# Patient Record
Sex: Female | Born: 1965 | Race: Black or African American | Hispanic: No | Marital: Married | Smoking: Never smoker
Health system: Southern US, Community
[De-identification: ages and names within clinical notes are randomized; demographics above are authoritative.]

## PROBLEM LIST (undated history)

## (undated) DIAGNOSIS — E079 Disorder of thyroid, unspecified: Secondary | ICD-10-CM

## (undated) DIAGNOSIS — D649 Anemia, unspecified: Secondary | ICD-10-CM

## (undated) HISTORY — PX: APPENDECTOMY: SHX54

## (undated) HISTORY — DX: Anemia, unspecified: D64.9

## (undated) HISTORY — DX: Disorder of thyroid, unspecified: E07.9

## (undated) HISTORY — PX: THYROIDECTOMY, PARTIAL: SHX18

---

## 1998-09-14 ENCOUNTER — Other Ambulatory Visit: Admission: RE | Admit: 1998-09-14 | Discharge: 1998-09-14 | Payer: Self-pay | Admitting: Obstetrics and Gynecology

## 1999-01-19 ENCOUNTER — Encounter: Admission: RE | Admit: 1999-01-19 | Discharge: 1999-04-19 | Payer: Self-pay | Admitting: Obstetrics & Gynecology

## 1999-03-01 ENCOUNTER — Inpatient Hospital Stay (HOSPITAL_COMMUNITY): Admission: AD | Admit: 1999-03-01 | Discharge: 1999-03-03 | Payer: Self-pay | Admitting: Obstetrics and Gynecology

## 1999-03-03 ENCOUNTER — Encounter (HOSPITAL_COMMUNITY): Admission: RE | Admit: 1999-03-03 | Discharge: 1999-06-01 | Payer: Self-pay | Admitting: Obstetrics & Gynecology

## 2001-10-25 ENCOUNTER — Emergency Department (HOSPITAL_COMMUNITY): Admission: EM | Admit: 2001-10-25 | Discharge: 2001-10-25 | Payer: Self-pay | Admitting: Emergency Medicine

## 2004-06-02 ENCOUNTER — Inpatient Hospital Stay (HOSPITAL_COMMUNITY): Admission: AD | Admit: 2004-06-02 | Discharge: 2004-06-05 | Payer: Self-pay | Admitting: Obstetrics & Gynecology

## 2006-02-21 ENCOUNTER — Ambulatory Visit (HOSPITAL_COMMUNITY): Admission: RE | Admit: 2006-02-21 | Discharge: 2006-02-22 | Payer: Self-pay | Admitting: General Surgery

## 2006-06-27 ENCOUNTER — Encounter: Admission: RE | Admit: 2006-06-27 | Discharge: 2006-06-27 | Payer: Self-pay | Admitting: Specialist

## 2010-12-23 ENCOUNTER — Encounter: Payer: Self-pay | Admitting: Obstetrics & Gynecology

## 2011-08-01 ENCOUNTER — Ambulatory Visit
Admission: RE | Admit: 2011-08-01 | Discharge: 2011-08-01 | Disposition: A | Discharge status: Home / Self Care | Payer: Self-pay | Source: Ambulatory Visit | Attending: Specialist | Admitting: Specialist

## 2011-08-01 ENCOUNTER — Other Ambulatory Visit: Payer: Self-pay | Admitting: Specialist

## 2011-08-01 DIAGNOSIS — R7611 Nonspecific reaction to tuberculin skin test without active tuberculosis: Secondary | ICD-10-CM

## 2012-10-16 ENCOUNTER — Telehealth: Payer: Self-pay

## 2012-10-16 MED ORDER — LEVOTHYROXINE SODIUM 150 MCG PO TABS: 150.0000 ug | ORAL_TABLET | Freq: Every day | ORAL | Status: DC

## 2012-10-16 NOTE — Telephone Encounter (Signed)
Notified pt that 1 mos Rx was sent in but then she will need OV/labs for add'l. Pt agreed and verified dosage.

## 2012-10-16 NOTE — Telephone Encounter (Signed)
Pt is requesting an rx refill on her thyroid medication her insurance will not be effective until sometime next month, pt will make an appt to see a dr for her next refill, [facility]. [phone]

## 2012-11-27 ENCOUNTER — Ambulatory Visit: Payer: Self-pay | Admitting: Family Medicine

## 2012-11-27 VITALS — BP 133/82 | HR 78 | Temp 98.0°F | Resp 16 | Ht 68.0 in | Wt 196.0 lb

## 2012-11-27 DIAGNOSIS — E039 Hypothyroidism, unspecified: Secondary | ICD-10-CM

## 2012-11-27 LAB — TSH: TSH: 10.301 u[IU]/mL — ABNORMAL HIGH (ref 0.350–4.500)

## 2012-11-27 MED ORDER — LEVOTHYROXINE SODIUM 150 MCG PO TABS: 150.0000 ug | ORAL_TABLET | Freq: Every day | ORAL | Status: DC

## 2012-11-27 NOTE — Progress Notes (Signed)
  Subjective:    Patient ID: Bianca Lewis, female    DOB: January 15, 1966, 46 y.o.   MRN: [id number]  HPI Bianca Lewis is a 46 y.o. female Hx of hypothyroidism., takes synthroid qd. Last ov Oct 2012.  cpe recommended then.  Ran out of synthroid past few days.   Last TSH 2.55 08/22/11 No new side effects with meds. No hair changes, weight change, skin change, but has had some fatigue.  Review of Systems As above.     Objective:   Physical Exam  Vitals reviewed. Constitutional: She is oriented to person, place, and time. She appears well-developed and well-nourished. No distress.  HENT:  Head: Normocephalic and atraumatic.  Right Ear: Hearing, tympanic membrane, external ear and ear canal normal.  Left Ear: Hearing, tympanic membrane, external ear and ear canal normal.  Nose: Nose normal.  Mouth/Throat: Oropharynx is clear and moist. No oropharyngeal exudate.  Eyes: Conjunctivae normal and EOM are normal. Pupils are equal, round, and reactive to light.  Neck: Neck supple. No thyromegaly present.  Cardiovascular: Normal rate, regular rhythm, normal heart sounds and intact distal pulses.   No murmur heard. Pulmonary/Chest: Effort normal and breath sounds normal. No respiratory distress. She has no wheezes. She has no rhonchi.  Lymphadenopathy:    She has no cervical adenopathy.  Neurological: She is alert and oriented to person, place, and time.  Skin: Skin is warm and dry. No rash noted.  Psychiatric: She has a normal mood and affect. Her behavior is normal.       Assessment & Plan:  Bianca Lewis is a 46 y.o. female 1. Hypothyroidism  TSH, levothyroxine (SYNTHROID, LEVOTHROID) 150 MCG tablet   meds refilled, but may need to recheck levels with adherence, as missed few days.  Discussed need for cpe.   . Patient Instructions  Your should receive a call or letter about your lab results within the next week to 10 days. We may need to recheck these levels after 6 weeks of continuous  use of meds if abnormal.  Schedule a complete physical in the next 6 months. Return to the clinic or go to the nearest emergency room if any of your symptoms worsen or new symptoms occur.

## 2012-11-27 NOTE — Patient Instructions (Signed)
Your should receive a call or letter about your lab results within the next week to 10 days. We may need to recheck these levels after 6 weeks of continuous use of meds if abnormal.  Schedule a complete physical in the next 6 months. Return to the clinic or go to the nearest emergency room if any of your symptoms worsen or new symptoms occur.

## 2012-12-06 ENCOUNTER — Other Ambulatory Visit: Payer: Self-pay | Admitting: Family Medicine

## 2012-12-06 DIAGNOSIS — E039 Hypothyroidism, unspecified: Secondary | ICD-10-CM

## 2013-03-23 ENCOUNTER — Ambulatory Visit (INDEPENDENT_AMBULATORY_CARE_PROVIDER_SITE_OTHER): Payer: BC Managed Care – PPO | Admitting: Physician Assistant

## 2013-03-23 VITALS — BP 140/80 | HR 77 | Temp 98.1°F | Resp 18 | Wt 193.0 lb

## 2013-03-23 DIAGNOSIS — J329 Chronic sinusitis, unspecified: Secondary | ICD-10-CM

## 2013-03-23 DIAGNOSIS — J309 Allergic rhinitis, unspecified: Secondary | ICD-10-CM

## 2013-03-23 MED ORDER — IPRATROPIUM BROMIDE 0.03 % NA SOLN: 2.0000 | Freq: Two times a day (BID) | NASAL | Status: DC

## 2013-03-23 MED ORDER — CETIRIZINE HCL 10 MG PO TABS: 10.0000 mg | ORAL_TABLET | Freq: Every day | ORAL | Status: DC

## 2013-03-23 MED ORDER — FLUTICASONE PROPIONATE 50 MCG/ACT NA SUSP: 2.0000 | Freq: Every day | NASAL | Status: DC

## 2013-03-23 MED ORDER — AMOXICILLIN-POT CLAVULANATE 875-125 MG PO TABS: 1.0000 | ORAL_TABLET | Freq: Two times a day (BID) | ORAL | Status: DC

## 2013-03-23 NOTE — Progress Notes (Signed)
  Subjective:    Patient ID: Bianca Lewis, female    DOB: August 02, 1966, 47 y.o.   MRN: [id number]  HPI    [name] is a 47 yr old female here with concern for illness.  States allergies and sinuses are bothering her.  Thinks this has been going on for 1-2 weeks, but has difficulty defining the duration of her symptoms.  Experiencing sinus pressure, esp in her forehead.  Nasal congestion, esp at night.  Fatigue, malaise.  Itchy eyes.  Does have history of allergies, takes Zyrtec prn but not regularly.  Denies fever.  States she has a hx of sinus problems, poorly draining sinuses.     Review of Systems  Constitutional: Positive for fatigue. Negative for fever and chills.  HENT: Positive for congestion, rhinorrhea and sinus pressure. Negative for ear pain.   Respiratory: Negative for cough, shortness of breath and wheezing.   Cardiovascular: Negative.   Gastrointestinal: Negative.   Musculoskeletal: Negative.   Allergic/Immunologic: Positive for environmental allergies.  Neurological: Positive for headaches.       Objective:   Physical Exam  Vitals reviewed. Constitutional: She is oriented to person, place, and time. She appears well-developed and well-nourished. No distress.  HENT:  Head: Normocephalic and atraumatic.  Right Ear: Tympanic membrane and ear canal normal.  Left Ear: Tympanic membrane and ear canal normal.  Nose: Mucosal edema and rhinorrhea present. Right sinus exhibits frontal sinus tenderness (on percussion). Right sinus exhibits no maxillary sinus tenderness. Left sinus exhibits no maxillary sinus tenderness and no frontal sinus tenderness.  Mouth/Throat: Uvula is midline, oropharynx is clear and moist and mucous membranes are normal.  Pale, boggy turbinates  Eyes: Conjunctivae are normal. No scleral icterus.  Neck: Neck supple.  Cardiovascular: Normal rate, regular rhythm and normal heart sounds.  Exam reveals no gallop and no friction rub.   No murmur  heard. Pulmonary/Chest: Effort normal and breath sounds normal. She has no wheezes. She has no rales.  Lymphadenopathy:    She has no cervical adenopathy.  Neurological: She is alert and oriented to person, place, and time.  Skin: Skin is warm and dry.  Psychiatric: She has a normal mood and affect. Her behavior is normal.     Filed Vitals:   03/23/13 1528  BP: 140/80  Pulse: 77  Temp: 98.1 F (36.7 C)  Resp: 18         Assessment & Plan:  Sinusitis - Plan: ipratropium (ATROVENT) 0.03 % nasal spray, amoxicillin-clavulanate (AUGMENTIN) 875-125 MG per tablet  -- Pt with 1-2 weeks of sinus pain/pressure with tenderness on percussion of right frontal sinus.  Will treat with amox/clav x 10 days.  Atrovent nasal spray for relief of congestion. Push fluids.  Rest.  RTC if worsening or not improving.  Allergic rhinitis - Plan: fluticasone (FLONASE) 50 MCG/ACT nasal spray, cetirizine (ZYRTEC) 10 MG tablet  --  Suspect there is an allergic component to her symptoms.  Discussed that poorly controlled allergies may contribute to the development of sinus infections.  Encouraged daily Zyrtec.  Also will begin Flonase daily.  In the future, encouraged her to begin these medicines prior to allergy season to hopefully prevent symptoms from becoming this severe.

## 2013-03-23 NOTE — Patient Instructions (Addendum)
Begin taking the antibiotic as directed.  Be sure to finish the full course.  Take with food to reduce stomach upset.  Begin using the Atrovent nasal spray twice daily to help with congestion.  Continue taking Zyrtec daily to help control your allergies.  Also begin using the Flonase nasal spray daily (serparate this from the Atrovent by 20-30 minutes to get the full effect of both).   Plenty of fluids.  If any of your symptoms are worsening or not improving, please let us know.,   Allergic Rhinitis Allergic rhinitis is when the mucous membranes in the nose respond to allergens. Allergens are particles in the air that cause your body to have an allergic reaction. This causes you to release allergic antibodies. Through a chain of events, these eventually cause you to release histamine into the blood stream (hence the use of antihistamines). Although meant to be protective to the body, it is this release that causes your discomfort, such as frequent sneezing, congestion and an itchy runny nose.  CAUSES  The pollen allergens may come from grasses, trees, and weeds. This is seasonal allergic rhinitis, or "hay fever." Other allergens cause year-round allergic rhinitis (perennial allergic rhinitis) such as house dust mite allergen, pet dander and mold spores.  SYMPTOMS   Nasal stuffiness (congestion).  Runny, itchy nose with sneezing and tearing of the eyes.  There is often an itching of the mouth, eyes and ears. It cannot be cured, but it can be controlled with medications. DIAGNOSIS  If you are unable to determine the offending allergen, skin or blood testing may find it. TREATMENT   Avoid the allergen.  Medications and allergy shots (immunotherapy) can help.  Hay fever may often be treated with antihistamines in pill or nasal spray forms. Antihistamines block the effects of histamine. There are over-the-counter medicines that may help with nasal congestion and swelling around the eyes. Check with  your caregiver before taking or giving this medicine. If the treatment above does not work, there are many new medications your caregiver can prescribe. Stronger medications may be used if initial measures are ineffective. Desensitizing injections can be used if medications and avoidance fails. Desensitization is when a patient is given ongoing shots until the body becomes less sensitive to the allergen. Make sure you follow up with your caregiver if problems continue. SEEK MEDICAL CARE IF:   You develop fever (more than 100.5 F (38.1 C).  You develop a cough that does not stop easily (persistent).  You have shortness of breath.  You start wheezing.  Symptoms interfere with normal daily activities. Document Released: 08/13/2001 Document Revised: 02/10/2012 Document Reviewed: 02/22/2009 [facility] Patient Information 2013 [place], [place].   Sinusitis Sinusitis is redness, soreness, and swelling (inflammation) of the paranasal sinuses. Paranasal sinuses are air pockets within the bones of your face (beneath the eyes, the middle of the forehead, or above the eyes). In healthy paranasal sinuses, mucus is able to drain out, and air is able to circulate through them by way of your nose. However, when your paranasal sinuses are inflamed, mucus and air can become trapped. This can allow bacteria and other germs to grow and cause infection. Sinusitis can develop quickly and last only a short time (acute) or continue over a long period (chronic). Sinusitis that lasts for more than 12 weeks is considered chronic.  CAUSES  Causes of sinusitis include:  Allergies.  Structural abnormalities, such as displacement of the cartilage that separates your nostrils (deviated septum), which can decrease  the air flow through your nose and sinuses and affect sinus drainage.  Functional abnormalities, such as when the small hairs (cilia) that line your sinuses and help remove mucus do not work properly or are not  present. SYMPTOMS  Symptoms of acute and chronic sinusitis are the same. The primary symptoms are pain and pressure around the affected sinuses. Other symptoms include:  Upper toothache.  Earache.  Headache.  Bad breath.  Decreased sense of smell and taste.  A cough, which worsens when you are lying flat.  Fatigue.  Fever.  Thick drainage from your nose, which often is green and may contain pus (purulent).  Swelling and warmth over the affected sinuses. DIAGNOSIS  Your caregiver will perform a physical exam. During the exam, your caregiver may:  Look in your nose for signs of abnormal growths in your nostrils (nasal polyps).  Tap over the affected sinus to check for signs of infection.  View the inside of your sinuses (endoscopy) with a special imaging device with a light attached (endoscope), which is inserted into your sinuses. If your caregiver suspects that you have chronic sinusitis, one or more of the following tests may be recommended:  Allergy tests.  Nasal culture A sample of mucus is taken from your nose and sent to a lab and screened for bacteria.  Nasal cytology A sample of mucus is taken from your nose and examined by your caregiver to determine if your sinusitis is related to an allergy. TREATMENT  Most cases of acute sinusitis are related to a viral infection and will resolve on their own within 10 days. Sometimes medicines are prescribed to help relieve symptoms (pain medicine, decongestants, nasal steroid sprays, or saline sprays).  However, for sinusitis related to a bacterial infection, your caregiver will prescribe antibiotic medicines. These are medicines that will help kill the bacteria causing the infection.  Rarely, sinusitis is caused by a fungal infection. In theses cases, your caregiver will prescribe antifungal medicine. For some cases of chronic sinusitis, surgery is needed. Generally, these are cases in which sinusitis recurs more than 3 times  per year, despite other treatments. HOME CARE INSTRUCTIONS   Drink plenty of water. Water helps thin the mucus so your sinuses can drain more easily.  Use a humidifier.  Inhale steam 3 to 4 times a day (for example, sit in the bathroom with the shower running).  Apply a warm, moist washcloth to your face 3 to 4 times a day, or as directed by your caregiver.  Use saline nasal sprays to help moisten and clean your sinuses.  Take over-the-counter or prescription medicines for pain, discomfort, or fever only as directed by your caregiver. SEEK IMMEDIATE MEDICAL CARE IF:  You have increasing pain or severe headaches.  You have nausea, vomiting, or drowsiness.  You have swelling around your face.  You have vision problems.  You have a stiff neck.  You have difficulty breathing. MAKE SURE YOU:   Understand these instructions.  Will watch your condition.  Will get help right away if you are not doing well or get worse. Document Released: 11/18/2005 Document Revised: 02/10/2012 Document Reviewed: 12/03/2011 [facility] Patient Information 2013 [place], [place].

## 2013-06-07 ENCOUNTER — Telehealth: Payer: Self-pay

## 2013-06-07 DIAGNOSIS — E039 Hypothyroidism, unspecified: Secondary | ICD-10-CM

## 2013-06-07 MED ORDER — LEVOTHYROXINE SODIUM 150 MCG PO TABS: 150.0000 ug | ORAL_TABLET | Freq: Every day | ORAL | Status: DC

## 2013-06-07 NOTE — Telephone Encounter (Signed)
Patient needs refill on thyroid medication Synthroid 150 mg. [facility] [phone]

## 2013-06-07 NOTE — Telephone Encounter (Signed)
Sent 15 d supply appt needed/ overdue

## 2013-06-24 ENCOUNTER — Ambulatory Visit (INDEPENDENT_AMBULATORY_CARE_PROVIDER_SITE_OTHER): Payer: BC Managed Care – PPO | Admitting: Internal Medicine

## 2013-06-24 VITALS — BP 134/78 | HR 81 | Temp 97.6°F | Resp 18 | Ht 62.0 in | Wt 200.0 lb

## 2013-06-24 DIAGNOSIS — E039 Hypothyroidism, unspecified: Secondary | ICD-10-CM

## 2013-06-24 DIAGNOSIS — D649 Anemia, unspecified: Secondary | ICD-10-CM

## 2013-06-24 DIAGNOSIS — Z833 Family history of diabetes mellitus: Secondary | ICD-10-CM

## 2013-06-24 LAB — COMPREHENSIVE METABOLIC PANEL
Albumin: 3.7 g/dL (ref 3.5–5.2)
BUN: 13 mg/dL (ref 6–23)
CO2: 21 mEq/L (ref 19–32)
Creat: 0.72 mg/dL (ref 0.50–1.10)
Glucose, Bld: 94 mg/dL (ref 70–99)
Potassium: 3.8 mEq/L (ref 3.5–5.3)
Sodium: 137 mEq/L (ref 135–145)
Total Protein: 7.1 g/dL (ref 6.0–8.3)

## 2013-06-24 LAB — GLUCOSE, POCT (MANUAL RESULT ENTRY): POC Glucose: 98 mg/dl (ref 70–99)

## 2013-06-24 LAB — POCT CBC
Hemoglobin: 9.2 g/dL — AB (ref 12.2–16.2)
MCH, POC: 21.5 pg — AB (ref 27–31.2)
MCHC: 29.4 g/dL — AB (ref 31.8–35.4)
MPV: 9.5 fL (ref 0–99.8)
POC Granulocyte: 3 (ref 2–6.9)
POC LYMPH PERCENT: 36.9 %L (ref 10–50)
POC MID %: 11.3 %M (ref 0–12)
RBC: 4.28 M/uL (ref 4.04–5.48)
RDW, POC: 19.2 %
WBC: 5.7 10*3/uL (ref 4.6–10.2)

## 2013-06-24 LAB — LIPID PANEL
LDL Cholesterol: 119 mg/dL — ABNORMAL HIGH (ref 0–99)
Triglycerides: 81 mg/dL (ref <150)
VLDL: 16 mg/dL (ref 0–40)

## 2013-06-24 LAB — IRON AND TIBC: Iron: 18 ug/dL — ABNORMAL LOW (ref 42–145)

## 2013-06-24 LAB — POCT GLYCOSYLATED HEMOGLOBIN (HGB A1C): Hemoglobin A1C: 6.2

## 2013-06-24 MED ORDER — LEVOTHYROXINE SODIUM 150 MCG PO TABS: 150.0000 ug | ORAL_TABLET | Freq: Every day | ORAL | Status: DC

## 2013-06-24 NOTE — Progress Notes (Signed)
  Subjective:    Patient ID: Bianca Lewis, female    DOB: 1966-02-17, 47 y.o.   MRN: [id number]  HPI Doing well, gaining weight, no problems. Here for thyroid medicine. No record of cpe in paper chart or epic. Will do blood work and she is to schedule cpe.   Review of Systems     Objective:   Physical Exam  Vitals reviewed. Constitutional: She is oriented to person, place, and time. She appears well-developed and well-nourished. No distress.  HENT:  Right Ear: External ear normal.  Left Ear: External ear normal.  Eyes: EOM are normal.  Neck: Normal range of motion. Neck supple. No thyromegaly present.  Cardiovascular: Normal rate, regular rhythm and normal heart sounds.   Pulmonary/Chest: Effort normal and breath sounds normal.  Musculoskeletal: Normal range of motion.  Neurological: She is alert and oriented to person, place, and time. She has normal reflexes. She exhibits normal muscle tone. Coordination normal.  Psychiatric: She has a normal mood and affect.   Results for orders placed in visit on 06/24/13  POCT CBC      Result Value Range   WBC 5.7  4.6 - 10.2 K/uL   Lymph, poc 2.1  0.6 - 3.4   POC LYMPH PERCENT 36.9  10 - 50 %L   MID (cbc) 0.6  0 - 0.9   POC MID % 11.3  0 - 12 %M   POC Granulocyte 3.0  2 - 6.9   Granulocyte percent 51.8  37 - 80 %G   RBC 4.28  4.04 - 5.48 M/uL   Hemoglobin 9.2 (*) 12.2 - 16.2 g/dL   HCT, POC 27.2 (*) 53.6 - 47.9 %   MCV 73.1 (*) 80 - 97 fL   MCH, POC 21.5 (*) 27 - 31.2 pg   MCHC 29.4 (*) 31.8 - 35.4 g/dL   RDW, POC 64.4     Platelet Count, POC 260  142 - 424 K/uL   MPV 9.5  0 - 99.8 fL  GLUCOSE, POCT (MANUAL RESULT ENTRY)      Result Value Range   POC Glucose 98  70 - 99 mg/dl  POCT GLYCOSYLATED HEMOGLOBIN (HGB A1C)      Result Value Range   Hemoglobin A1C 6.2      Anemia low mcv will add iron tibc  Hypothyroid/Overweight/Family hx diabetes.    Assessment & Plan:  Need CPE before next refill/Start womens vitamin with  iron/Schedule f/up 104

## 2013-06-24 NOTE — Patient Instructions (Addendum)
Diabetes and Exercise Regular exercise is important and can help:   Control blood glucose (sugar).  Decrease blood pressure.    Control blood lipids (cholesterol, triglycerides).  Improve overall health. BENEFITS FROM EXERCISE  Improved fitness.  Improved flexibility.  Improved endurance.  Increased bone density.  Weight control.  Increased muscle strength.  Decreased body fat.  Improvement of the body's use of insulin, a hormone.  Increased insulin sensitivity.  Reduction of insulin needs.  Reduced stress and tension.  Helps you feel better. People with diabetes who add exercise to their lifestyle gain additional benefits, including:  Weight loss.  Reduced appetite.  Improvement of the body's use of blood glucose.  Decreased risk factors for heart disease:  Lowering of cholesterol and triglycerides.  Raising the level of good cholesterol (high-density lipoproteins, HDL).  Lowering blood sugar.  Decreased blood pressure. TYPE 1 DIABETES AND EXERCISE  Exercise will usually lower your blood glucose.  If blood glucose is greater than 240 mg/dl, check urine ketones. If ketones are present, do not exercise.  Location of the insulin injection sites may need to be adjusted with exercise. Avoid injecting insulin into areas of the body that will be exercised. For example, avoid injecting insulin into:  The arms when playing tennis.  The legs when jogging. For more information, discuss this with your caregiver.  Keep a record of:  Food intake.  Type and amount of exercise.  Expected peak times of insulin action.  Blood glucose levels. Do this before, during, and after exercise. Review your records with your caregiver. This will help you to develop guidelines for adjusting food intake and insulin amounts.  TYPE 2 DIABETES AND EXERCISE  Regular physical activity can help control blood glucose.  Exercise is important because it may:  Increase the  body's sensitivity to insulin.  Improve blood glucose control.  Exercise reduces the risk of heart disease. It decreases serum cholesterol and triglycerides. It also lowers blood pressure.  Those who take insulin or oral hypoglycemic agents should watch for signs of hypoglycemia. These signs include dizziness, shaking, sweating, chills, and confusion.  Body water is lost during exercise. It must be replaced. This will help to avoid loss of body fluids (dehydration) or heat stroke. Be sure to talk to your caregiver before starting an exercise program to make sure it is safe for you. Remember, any activity is better than none.  Document Released: 02/08/2004 Document Revised: 02/10/2012 Document Reviewed: 05/25/2009 [facility] Patient Information 2014 [place], [place]. Thyroid Diseases Your thyroid is a butterfly-shaped gland in your neck. It is located just above your collarbone. It is one of your endocrine glands, which make hormones. The thyroid helps set your metabolism. Metabolism is how your body gets energy from the foods you eat.  Millions of people have thyroid diseases. Women experience thyroid problems more often than men. In fact, overactive thyroid problems (hyperthyroidism) occur in 1% of all women. If you have a thyroid disease, your body may use energy more slowly or quickly than it should.  Thyroid problems also include an immune disease where your body reacts against your thyroid gland (called thyroiditis). A different problem involves lumps and bumps (called nodules) that develop in the gland. The nodules are usually, but not always, noncancerous. THE MOST COMMON THYROID PROBLEMS AND CAUSES ARE DISCUSSED BELOW There are many causes for thyroid problems. Treatment depends upon the exact diagnosis and includes trying to reset your body's metabolism to a normal rate. Hyperthyroidism Too much thyroid hormone from an  overactive thyroid gland is called hyperthyroidism. In hyperthyroidism,  the body's metabolism speeds up. One of the most frequent forms of hyperthyroidism is known as Graves' disease. Graves' disease tends to run in families. Although Graves' is thought to be caused by a problem with the immune system, the exact nature of the genetic problem is unknown. Hypothyroidism Too little thyroid hormone from an underactive thyroid gland is called hypothyroidism. In hypothyroidism, the body's metabolism is slowed. Several things can cause this condition. Most causes affect the thyroid gland directly and hurt its ability to make enough hormone.  Rarely, there may be a pituitary gland tumor (located near the base of the brain). The tumor can block the pituitary from producing thyroid-stimulating hormone (TSH). Your body makes TSH to stimulate the thyroid to work properly. If the pituitary does not make enough TSH, the thyroid fails to make enough hormones needed for good health. Whether the problem is caused by thyroid conditions or by the pituitary gland, the result is that the thyroid is not making enough hormones. Hypothyroidism causes many physical and mental processes to become sluggish. The body consumes less oxygen and produces less body heat. Thyroid Nodules A thyroid nodule is a small swelling or lump in the thyroid gland. They are common. These nodules represent either a growth of thyroid tissue or a fluid-filled cyst. Both form a lump in the thyroid gland. Almost half of all people will have tiny thyroid nodules at some point in their lives. Typically, these are not noticeable until they become large and affect normal thyroid size. Larger nodules that are greater than a half inch across (about 1 centimeter) occur in about 5 percent of people. Although most nodules are not cancerous, people who have them should seek medical care to rule out cancer. Also, some thyroid nodules may produce too much thyroid hormone or become too large. Large nodules or a large gland can interfere with  breathing or swallowing or may cause neck discomfort. Other problems Other thyroid problems include cancer and thyroiditis. Thyroiditis is a malfunction of the body's immune system. Normally, the immune system works to defend the body against infection and other problems. When the immune system is not working properly, it may mistakenly attack normal cells, tissues, and organs. Examples of autoimmune diseases are Hashimoto's thyroiditis (which causes low thyroid function) and Graves' disease (which causes excess thyroid function). SYMPTOMS  Symptoms vary greatly depending upon the exact type of problem with the thyroid. Hyperthyroidism-is when your thyroid is too active and makes more thyroid hormone than your body needs. The most common cause is Graves' Disease. Too much thyroid hormone can cause some or all of the following symptoms:  Anxiety.  Irritability.  Difficulty sleeping.  Fatigue.  A rapid or irregular heartbeat.  A fine tremor of your hands or fingers.  An increase in perspiration.  Sensitivity to heat.  Weight loss, despite normal food intake.  Brittle hair.  Enlargement of your thyroid gland (goiter).  Light menstrual periods.  Frequent bowel movements. Graves' disease can specifically cause eye and skin problems. The skin problems involve reddening and swelling of the skin, often on your shins and on the top of your feet. Eye problems can include the following:  Excess tearing and sensation of grit or sand in either or both eyes.  Reddened or inflamed eyes.  Widening of the space between your eyelids.  Swelling of the lids and tissues around the eyes.  Light sensitivity.  Ulcers on the cornea.  Double vision.  Limited eye movements.  Blurred or reduced vision. Hypothyroidism- is when your thyroid gland is not active enough. This is more common than hyperthyroidism. Symptoms can vary a lot depending of the severity of the hormone deficiency. Symptoms  may develop over a long period of time and can include several of the following:  Fatigue.  Sluggishness.  Increased sensitivity to cold.  Constipation.  Pale, dry skin.  A puffy face.  Hoarse voice.  High blood cholesterol level.  Unexplained weight gain.  Muscle aches, tenderness and stiffness.  Pain, stiffness or swelling in your joints.  Muscle weakness.  Heavier than normal menstrual periods.  Brittle fingernails and hair.  Depression. Thyroid Nodules - most do not cause signs or symptoms. Occasionally, some may become so large that you can feel or even see the swelling at the base of your neck. You may realize a lump or swelling is there when you are shaving or putting on makeup. Men might become aware of a nodule when shirt collars suddenly feel too tight. Some nodules produce too much thyroid hormone. This can produce the same symptoms as hyperthyroidism (see above). Thyroid nodules are seldom cancerous. However, a nodule is more likely to be malignant (cancerous) if it:  Grows quickly or feels hard.  Causes you to become hoarse or to have trouble swallowing or breathing.  Causes enlarged lymph nodes under your jaw or in your neck. DIAGNOSIS  Because there are so many possible thyroid conditions, your caregiver may ask for a number of tests. They will do this in order to narrow down the exact diagnosis. These tests can include:  Blood and antibody tests.  Special thyroid scans using small, safe amounts of radioactive iodine.  Ultrasound of the thyroid gland (particularly if there is a nodule or lump).  Biopsy. This is usually done with a special needle. A needle biopsy is a procedure to obtain a sample of cells from the thyroid. The tissue will be tested in a lab and examined under a microscope. TREATMENT  Treatment depends on the exact diagnosis. Hyperthyroidism  Beta-blockers help relieve many of the symptoms.  Anti-thyroid medications prevent the  thyroid from making excess hormones.  Radioactive iodine treatment can destroy overactive thyroid cells. The iodine can permanently decrease the amount of hormone produced.  Surgery to remove the thyroid gland.  Treatments for eye problems that come from Graves' disease also include medications and special eye surgery, if felt to be appropriate. Hypothyroidism Thyroid replacement with levothyroxine is the mainstay of treatment. Treatment with thyroid replacement is usually lifelong and will require monitoring and adjustment from time to time. Thyroid Nodules  Watchful waiting. If a small nodule causes no symptoms or signs of cancer on biopsy, then no treatment may be chosen at first. Re-exam and re-checking blood tests would be the recommended follow-up.  Anti-thyroid medications or radioactive iodine treatment may be recommended if the nodules produce too much thyroid hormone (see Treatment for Hyperthyroidism above).  Alcohol ablation. Injections of small amounts of ethyl alcohol (ethanol) can cause a non-cancerous nodule to shrink in size.  Surgery (see Treatment for Hyperthyroidism above). HOME CARE INSTRUCTIONS   Take medications as instructed.  Follow through on recommended testing. SEEK MEDICAL CARE IF:   You feel that you are developing symptoms of Hyperthyroidism or Hypothyroidism as described above.  You develop a new lump/nodule in the neck/thyroid area that you had not noticed before.  You feel that you are having side effects from medicines prescribed.  You develop  trouble breathing or swallowing. SEEK IMMEDIATE MEDICAL CARE IF:   You develop a fever of 102 F (38.9 C) or higher.  You develop severe sweating.  You develop palpitations and/or rapid heart beat.  You develop shortness of breath.  You develop nausea and vomiting.  You develop extreme shakiness.  You develop agitation.  You develop lightheadedness or have a fainting episode. Document  Released: 09/15/2007 Document Revised: 02/10/2012 Document Reviewed: 09/15/2007 [facility] Patient Information 2014 [place], [place]. Anemia, Nonspecific Your exam and blood tests show you are anemic. This means your blood (hemoglobin) level is low. Normal hemoglobin values are 12 to 15 g/dL for females and 14 to 17 g/dL for males. Make a note of your hemoglobin level today. The hematocrit percent is also used to measure anemia. A normal hematocrit is 38% to 46% in females and 42% to 49% in males. Make a note of your hematocrit level today. CAUSES  Anemia can be due to many different causes.  Excessive bleeding from periods (in women).  Intestinal bleeding.  Poor nutrition.  Kidney, thyroid, liver, and bone marrow diseases. SYMPTOMS  Anemia can come on suddenly (acute). It can also come on slowly. Symptoms can include:  Minor weakness.  Dizziness.  Palpitations.  Shortness of breath. Symptoms may be absent until half your hemoglobin is missing if it comes on slowly. Anemia due to acute blood loss from an injury or internal bleeding may require blood transfusion if the loss is severe. Hospital care is needed if you are anemic and there is significant continual blood loss. TREATMENT   Stool tests for blood (Hemoccult) and additional lab tests are often needed. This determines the best treatment.  Further checking on your condition and your response to treatment is very important. It often takes many weeks to correct anemia. Depending on the cause, treatment can include:  Supplements of iron.  Vitamins B12 and folic acid.  Hormone medicines. If your anemia is due to bleeding, finding the cause of the blood loss is very important. This will help avoid further problems. SEEK IMMEDIATE MEDICAL CARE IF:   You develop fainting, extreme weakness, shortness of breath, or chest pain.  You develop heavy vaginal bleeding.  You develop bloody or black, tarry stools or vomit up  blood.  You develop a high fever, rash, repeated vomiting, or dehydration. Document Released: 12/26/2004 Document Revised: 02/10/2012 Document Reviewed: 10/03/2009 [facility] Patient Information 2014 [place], [place].

## 2013-06-25 ENCOUNTER — Encounter: Payer: Self-pay | Admitting: Family Medicine

## 2013-07-01 ENCOUNTER — Telehealth: Payer: Self-pay

## 2013-07-01 NOTE — Telephone Encounter (Signed)
Pt is calling about her lab results  Best number [phone]

## 2013-07-02 NOTE — Telephone Encounter (Signed)
Your thyroid is normal, your iron is low and needs replacement. Recheck your cbc and iron in 1 month. LMOM to CB

## 2013-07-02 NOTE — Telephone Encounter (Signed)
Pt.notified

## 2014-01-05 ENCOUNTER — Ambulatory Visit (INDEPENDENT_AMBULATORY_CARE_PROVIDER_SITE_OTHER): Payer: BC Managed Care – PPO | Admitting: Emergency Medicine

## 2014-01-05 VITALS — BP 150/90 | HR 61 | Temp 98.3°F | Resp 17 | Ht 62.0 in | Wt 194.0 lb

## 2014-01-05 DIAGNOSIS — J309 Allergic rhinitis, unspecified: Secondary | ICD-10-CM

## 2014-01-05 DIAGNOSIS — E039 Hypothyroidism, unspecified: Secondary | ICD-10-CM

## 2014-01-05 LAB — POCT CBC
GRANULOCYTE PERCENT: 51.7 % (ref 37–80)
HCT, POC: 36 % — AB (ref 37.7–47.9)
Hemoglobin: 10.5 g/dL — AB (ref 12.2–16.2)
LYMPH, POC: 2 (ref 0.6–3.4)
MCH, POC: 22.8 pg — AB (ref 27–31.2)
MCHC: 29.2 g/dL — AB (ref 31.8–35.4)
MCV: 78.1 fL — AB (ref 80–97)
MID (cbc): 0.5 (ref 0–0.9)
MPV: 11.4 fL (ref 0–99.8)
POC Granulocyte: 2.7 (ref 2–6.9)
POC LYMPH %: 38.7 % (ref 10–50)
POC MID %: 9.6 %M (ref 0–12)
Platelet Count, POC: 213 10*3/uL (ref 142–424)
RBC: 4.61 M/uL (ref 4.04–5.48)
RDW, POC: 21 %
WBC: 5.2 10*3/uL (ref 4.6–10.2)

## 2014-01-05 LAB — TSH: TSH: 0.765 u[IU]/mL (ref 0.350–4.500)

## 2014-01-05 LAB — T4, FREE: FREE T4: 1.01 ng/dL (ref 0.80–1.80)

## 2014-01-05 MED ORDER — LEVOTHYROXINE SODIUM 150 MCG PO TABS: 150.0000 ug | ORAL_TABLET | Freq: Every day | ORAL | Status: DC

## 2014-01-05 MED ORDER — FLUTICASONE PROPIONATE 50 MCG/ACT NA SUSP: 2.0000 | Freq: Every day | NASAL | Status: DC

## 2014-01-05 MED ORDER — AMOXICILLIN 875 MG PO TABS: 875.0000 mg | ORAL_TABLET | Freq: Two times a day (BID) | ORAL | Status: DC

## 2014-01-05 NOTE — Patient Instructions (Addendum)
Sinusitis Sinusitis is redness, soreness, and swelling (inflammation) of the paranasal sinuses. Paranasal sinuses are air pockets within the bones of your face (beneath the eyes, the middle of the forehead, or above the eyes). In healthy paranasal sinuses, mucus is able to drain out, and air is able to circulate through them by way of your nose. However, when your paranasal sinuses are inflamed, mucus and air can become trapped. This can allow bacteria and other germs to grow and cause infection. Sinusitis can develop quickly and last only a short time (acute) or continue over a long period (chronic). Sinusitis that lasts for more than 12 weeks is considered chronic.  CAUSES  Causes of sinusitis include:  Allergies.  Structural abnormalities, such as displacement of the cartilage that separates your nostrils (deviated septum), which can decrease the air flow through your nose and sinuses and affect sinus drainage.  Functional abnormalities, such as when the small hairs (cilia) that line your sinuses and help remove mucus do not work properly or are not present. SYMPTOMS  Symptoms of acute and chronic sinusitis are the same. The primary symptoms are pain and pressure around the affected sinuses. Other symptoms include:  Upper toothache.  Earache.  Headache.  Bad breath.  Decreased sense of smell and taste.  A cough, which worsens when you are lying flat.  Fatigue.  Fever.  Thick drainage from your nose, which often is green and may contain pus (purulent).  Swelling and warmth over the affected sinuses. DIAGNOSIS  Your caregiver will perform a physical exam. During the exam, your caregiver may:  Look in your nose for signs of abnormal growths in your nostrils (nasal polyps).  Tap over the affected sinus to check for signs of infection.  View the inside of your sinuses (endoscopy) with a special imaging device with a light attached (endoscope), which is inserted into your  sinuses. If your caregiver suspects that you have chronic sinusitis, one or more of the following tests may be recommended:  Allergy tests.  Nasal culture A sample of mucus is taken from your nose and sent to a lab and screened for bacteria.  Nasal cytology A sample of mucus is taken from your nose and examined by your caregiver to determine if your sinusitis is related to an allergy. TREATMENT  Most cases of acute sinusitis are related to a viral infection and will resolve on their own within 10 days. Sometimes medicines are prescribed to help relieve symptoms (pain medicine, decongestants, nasal steroid sprays, or saline sprays).  However, for sinusitis related to a bacterial infection, your caregiver will prescribe antibiotic medicines. These are medicines that will help kill the bacteria causing the infection.  Rarely, sinusitis is caused by a fungal infection. In theses cases, your caregiver will prescribe antifungal medicine. For some cases of chronic sinusitis, surgery is needed. Generally, these are cases in which sinusitis recurs more than 3 times per year, despite other treatments. HOME CARE INSTRUCTIONS   Drink plenty of water. Water helps thin the mucus so your sinuses can drain more easily.  Use a humidifier.  Inhale steam 3 to 4 times a day (for example, sit in the bathroom with the shower running).  Apply a warm, moist washcloth to your face 3 to 4 times a day, or as directed by your caregiver.  Use saline nasal sprays to help moisten and clean your sinuses.  Take over-the-counter or prescription medicines for pain, discomfort, or fever only as directed by your caregiver. SEEK IMMEDIATE MEDICAL   CARE IF:  You have increasing pain or severe headaches.  You have nausea, vomiting, or drowsiness.  You have swelling around your face.  You have vision problems.  You have a stiff neck.  You have difficulty breathing. MAKE SURE YOU:   Understand these  instructions.  Will watch your condition.  Will get help right away if you are not doing well or get worse. Document Released: 11/18/2005 Document Revised: 02/10/2012 Document Reviewed: 12/03/2011 [facility] Patient Information 2014 [place], [place]. Anemia, Nonspecific Anemia is a condition in which the concentration of red blood cells or hemoglobin in the blood is below normal. Hemoglobin is a substance in red blood cells that carries oxygen to the tissues of the body. Anemia results in not enough oxygen reaching these tissues.  CAUSES  Common causes of anemia include:   Excessive bleeding. Bleeding may be internal or external. This includes excessive bleeding from periods (in women) or from the intestine.   Poor nutrition.   Chronic kidney, thyroid, and liver disease.  Bone marrow disorders that decrease red blood cell production.  Cancer and treatments for cancer.  HIV, AIDS, and their treatments.  Spleen problems that increase red blood cell destruction.  Blood disorders.  Excess destruction of red blood cells due to infection, medicines, and autoimmune disorders. SIGNS AND SYMPTOMS   Minor weakness.   Dizziness.   Headache.  Palpitations.   Shortness of breath, especially with exercise.   Paleness.  Cold sensitivity.  Indigestion.  Nausea.  Difficulty sleeping.  Difficulty concentrating. Symptoms may occur suddenly or they may develop slowly.  DIAGNOSIS  Additional blood tests are often needed. These help your health care provider determine the best treatment. Your health care provider will check your stool for blood and look for other causes of blood loss.  TREATMENT  Treatment varies depending on the cause of the anemia. Treatment can include:   Supplements of iron, vitamin D98, or folic acid.   Hormone medicines.   A blood transfusion. This may be needed if blood loss is severe.   Hospitalization. This may be needed if there is  significant continual blood loss.   Dietary changes.  Spleen removal. HOME CARE INSTRUCTIONS Keep all follow-up appointments. It often takes many weeks to correct anemia, and having your health care provider check on your condition and your response to treatment is very important. SEEK IMMEDIATE MEDICAL CARE IF:   You develop extreme weakness, shortness of breath, or chest pain.   You become dizzy or have trouble concentrating.  You develop heavy vaginal bleeding.   You develop a rash.   You have bloody or black, tarry stools.   You faint.   You vomit up blood.   You vomit repeatedly.   You have abdominal pain.  You have a fever or persistent symptoms for more than 2 3 days.   You have a fever and your symptoms suddenly get worse.   You are dehydrated.  MAKE SURE YOU:  Understand these instructions.  Will watch your condition.  Will get help right away if you are not doing well or get worse. Document Released: 12/26/2004 Document Revised: 07/21/2013 Document Reviewed: 05/14/2013 [facility] Patient Information 2014 [place].

## 2014-01-05 NOTE — Progress Notes (Addendum)
   Subjective:  This chart was scribed for Wardell Honour, MD by Eugenia Mcalpine, ED Scribe. This patient was seen in room Room/bed info not found and the patient's care was started at 12:19 PM.   Patient ID: Bianca Lewis, female    DOB: 11-21-1966, 48 y.o.   MRN: [id number]  Sinusitis Associated symptoms include congestion and sinus pressure.   HPI Comments: Bianca Lewis is a 48 y.o. female who presents to the Emergency Department to do her blood work for her thyroid medication. She ran out of her medication 3x days ago. She also complains of sinus congestion with yellow sputum that is worse at night; she states that she also needs nasal spray for the sinus congestion.  Pt has had prior occurences of similar symptoms.  Pt typically used antibiotics and OTC medications for her sinus congestion.  Patient Active Problem List   Diagnosis Date Noted  . Hypothyroidism 11/27/2012   Past Medical History  Diagnosis Date  . Thyroid disease    Past Surgical History  Procedure Laterality Date  . Appendectomy     No Known Allergies Prior to Admission medications   Medication Sig Start Date End Date Taking? Authorizing Provider  levothyroxine (SYNTHROID, LEVOTHROID) 150 MCG tablet Take 1 tablet (150 mcg total) by mouth daily. 06/24/13  Yes Orma Flaming, MD   History   Social History  . Marital Status: Married    Spouse Name: N/A    Number of Children: N/A  . Years of Education: N/A   Occupational History  . CNA    Social History Main Topics  . Smoking status: Never Smoker   . Smokeless tobacco: Not on file  . Alcohol Use: No  . Drug Use: No  . Sexual Activity: Yes    Birth Control/ Protection: None   Other Topics Concern  . Not on file   Social History Narrative  . No narrative on file        Review of Systems  HENT: Positive for congestion, rhinorrhea and sinus pressure.        Objective:   Physical Exam  Constitutional: She appears well-developed.  HENT:  Head:  Normocephalic.  Nose: Mucosal edema and rhinorrhea present.  Mouth/Throat: No oropharyngeal exudate.  Swollen nasal turbinates and purulent drainage on the right  Eyes: EOM are normal. Pupils are equal, round, and reactive to light.  Neck: Neck supple.  Cardiovascular: Normal rate, regular rhythm and normal heart sounds.   No murmur heard. Pulmonary/Chest: Effort normal. No respiratory distress.  Lymphadenopathy:    She has no cervical adenopathy.    Filed Vitals:   01/05/14 1125  BP: 150/90  Pulse: 61  Temp: 98.3 F (36.8 C)  TempSrc: Oral  Resp: 17  Height: 5\' 2"  (1.575 m)  Weight: 194 lb (87.998 kg)  SpO2: 100%         Assessment & Plan:  Patient is hypothyroid on replacement. She has been out of her medicine the last 2-3 days. She has no other medical problems outside of her sinus infection. She was given amoxicillin and Flonase for this. She is anemic but hemoglobin has improved from 6 months ago. We'll continue her iron because her anemia is most likely secondary to her heavy periods.   I personally performed the services described in this documentation, which was scribed in my presence. The recorded information has been reviewed and is accurate.

## 2014-05-23 ENCOUNTER — Ambulatory Visit (INDEPENDENT_AMBULATORY_CARE_PROVIDER_SITE_OTHER): Payer: BC Managed Care – PPO | Admitting: Physician Assistant

## 2014-05-23 VITALS — BP 126/72 | HR 104 | Temp 100.0°F | Resp 18 | Ht 62.5 in | Wt 190.0 lb

## 2014-05-23 DIAGNOSIS — H9209 Otalgia, unspecified ear: Secondary | ICD-10-CM

## 2014-05-23 DIAGNOSIS — H9201 Otalgia, right ear: Secondary | ICD-10-CM

## 2014-05-23 DIAGNOSIS — J029 Acute pharyngitis, unspecified: Secondary | ICD-10-CM

## 2014-05-23 DIAGNOSIS — R509 Fever, unspecified: Secondary | ICD-10-CM

## 2014-05-23 DIAGNOSIS — J020 Streptococcal pharyngitis: Secondary | ICD-10-CM

## 2014-05-23 LAB — POCT RAPID STREP A (OFFICE): Rapid Strep A Screen: POSITIVE — AB

## 2014-05-23 MED ORDER — PENICILLIN G BENZATHINE 1200000 UNIT/2ML IM SUSP
1.2000 10*6.[IU] | Freq: Once | INTRAMUSCULAR | Status: AC
  Administered 2014-05-23: 1.2 10*6.[IU] via INTRAMUSCULAR

## 2014-05-23 NOTE — Progress Notes (Signed)
   Subjective:    Patient ID: Bianca Lewis, female    DOB: 03-17-1966, 48 y.o.   MRN: [id number]  HPI 48 year old female presents for evaluation of 3 day history of worsening sore throat. States it is very painful to swallow and she now has associated ear pain. R>L. Complains of slight nasal congestion and cough. Admits to fever and chills. Slight nausea but no vomiting.   Denies SOB, chest pain, dizziness, or abdominal pain.  No known strep contacts or hx of frequent strep infections.  Works at a nursing home.     Review of Systems  Constitutional: Positive for fever, chills and fatigue.  HENT: Positive for congestion, postnasal drip and sore throat. Negative for trouble swallowing.   Respiratory: Positive for cough. Negative for shortness of breath and wheezing.   Cardiovascular: Negative for chest pain.  Gastrointestinal: Positive for nausea. Negative for vomiting and abdominal pain.  Neurological: Negative for dizziness and headaches.       Objective:   Physical Exam  Constitutional: She is oriented to person, place, and time. She appears well-developed and well-nourished.  HENT:  Head: Normocephalic and atraumatic.  Right Ear: Hearing, tympanic membrane, external ear and ear canal normal.  Left Ear: Hearing, tympanic membrane, external ear and ear canal normal.  Mouth/Throat: Uvula is midline and mucous membranes are normal. Posterior oropharyngeal erythema (2+ tonsillar swelling bilaterally) present. No oropharyngeal exudate, posterior oropharyngeal edema or tonsillar abscesses.  Eyes: Conjunctivae are normal.  Neck: Normal range of motion. Neck supple.  Cardiovascular: Normal rate, regular rhythm and normal heart sounds.   Pulmonary/Chest: Effort normal and breath sounds normal.  Lymphadenopathy:    She has cervical adenopathy.  Neurological: She is alert and oriented to person, place, and time.  Psychiatric: She has a normal mood and affect. Her behavior is normal. Judgment  and thought content normal.      Results for orders placed in visit on 05/23/14  POCT RAPID STREP A (OFFICE)      Result Value Ref Range   Rapid Strep A Screen Positive (*) Negative   Tylenol 1000 mg po given in office today.     Assessment & Plan:  Strep pharyngitis - Plan: penicillin g benzathine (BICILLIN LA) 1200000 UNIT/2ML injection 1.2 Million Units  Acute pharyngitis, unspecified pharyngitis type - Plan: POCT rapid strep A  Fever, unspecified  Otalgia of right ear  Bicillin 1.2 million units IM today.  Continue tylenol or ibuprofen prn pain, fever Push fluids. Change toothbrush Out of work today and tomorrow Follow up if symptoms worsening or fail to improve.

## 2014-09-05 ENCOUNTER — Ambulatory Visit (INDEPENDENT_AMBULATORY_CARE_PROVIDER_SITE_OTHER): Payer: BC Managed Care – PPO

## 2014-09-05 ENCOUNTER — Ambulatory Visit (INDEPENDENT_AMBULATORY_CARE_PROVIDER_SITE_OTHER): Payer: BC Managed Care – PPO | Admitting: Internal Medicine

## 2014-09-05 VITALS — BP 148/78 | HR 82 | Temp 97.7°F | Resp 16 | Ht 62.5 in | Wt 195.0 lb

## 2014-09-05 DIAGNOSIS — R0789 Other chest pain: Secondary | ICD-10-CM

## 2014-09-05 DIAGNOSIS — M79642 Pain in left hand: Secondary | ICD-10-CM

## 2014-09-05 MED ORDER — IBUPROFEN 600 MG PO TABS: 600.0000 mg | ORAL_TABLET | Freq: Three times a day (TID) | ORAL | Status: DC | PRN

## 2014-09-05 MED ORDER — METHOCARBAMOL 750 MG PO TABS: 750.0000 mg | ORAL_TABLET | Freq: Four times a day (QID) | ORAL | Status: DC
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
## 2015-01-05 ENCOUNTER — Ambulatory Visit (INDEPENDENT_AMBULATORY_CARE_PROVIDER_SITE_OTHER): Payer: No Typology Code available for payment source | Admitting: Physician Assistant

## 2015-01-05 VITALS — BP 142/74 | HR 75 | Temp 98.1°F | Resp 20 | Ht 62.0 in | Wt 193.2 lb

## 2015-01-05 DIAGNOSIS — E039 Hypothyroidism, unspecified: Secondary | ICD-10-CM

## 2015-01-05 DIAGNOSIS — D649 Anemia, unspecified: Secondary | ICD-10-CM

## 2015-01-05 DIAGNOSIS — J309 Allergic rhinitis, unspecified: Secondary | ICD-10-CM

## 2015-01-05 LAB — CBC
HCT: 31.5 % — ABNORMAL LOW (ref 36.0–46.0)
Hemoglobin: 9.3 g/dL — ABNORMAL LOW (ref 12.0–15.0)
MCH: 19.8 pg — ABNORMAL LOW (ref 26.0–34.0)
MCHC: 29.5 g/dL — AB (ref 30.0–36.0)
MCV: 67.2 fL — ABNORMAL LOW (ref 78.0–100.0)
PLATELETS: 237 10*3/uL (ref 150–400)
RBC: 4.69 MIL/uL (ref 3.87–5.11)
RDW: 19.7 % — ABNORMAL HIGH (ref 11.5–15.5)
WBC: 4.9 10*3/uL (ref 4.0–10.5)

## 2015-01-05 LAB — IRON: Iron: 11 ug/dL — ABNORMAL LOW (ref 42–145)

## 2015-01-05 LAB — IBC PANEL
%SAT: 2 % — ABNORMAL LOW (ref 20–55)
TIBC: 452 ug/dL (ref 250–470)
UIBC: 441 ug/dL — AB (ref 125–400)

## 2015-01-05 MED ORDER — FERROUS GLUCONATE 324 (38 FE) MG PO TABS: 324.0000 mg | ORAL_TABLET | Freq: Every day | ORAL | Status: AC

## 2015-01-05 MED ORDER — LORATADINE 10 MG PO TABS: 10.0000 mg | ORAL_TABLET | Freq: Every day | ORAL | Status: AC

## 2015-01-05 MED ORDER — FLUTICASONE PROPIONATE 50 MCG/ACT NA SUSP: 2.0000 | Freq: Every day | NASAL | Status: AC

## 2015-01-05 MED ORDER — LEVOTHYROXINE SODIUM 150 MCG PO TABS: 150.0000 ug | ORAL_TABLET | Freq: Every day | ORAL | Status: DC

## 2015-01-05 NOTE — Patient Instructions (Signed)
We filled your thyroid med for a year, I will be in touch with you with the lab results. I gave you a script for iron. Please follow the instructions to take this. I will be in touch with you with the lab results. Please take the flonase and claritin daily for your allergies.  Please come back to see [place] soon for a complete physical exam so we can make sure we're looking over everything.

## 2015-01-05 NOTE — Progress Notes (Signed)
Subjective:    Patient ID: Bianca Lewis, female    DOB: 1966-09-06, 49 y.o.   MRN: [id number]  Chief Complaint  Patient presents with  . Sinusitis    congestion ; sneezing x few days  . Medication Refill    synthroid   Prior to Admission medications   Medication Sig Start Date End Date Taking? Authorizing Provider  levothyroxine (SYNTHROID, LEVOTHROID) 150 MCG tablet Take 1 tablet (150 mcg total) by mouth daily. 01/05/15  Yes Araceli Bouche, PA  ferrous gluconate (FERGON) 324 MG tablet Take 1 tablet (324 mg total) by mouth daily with breakfast. Do not take at the same time as your thyroid medication. Do not take at same time as iron containing foods. 01/05/15   Dane Kopke, PA  fluticasone (FLONASE) 50 MCG/ACT nasal spray Place 2 sprays into both nostrils daily. 01/05/15   Araceli Bouche, PA  loratadine (CLARITIN) 10 MG tablet Take 1 tablet (10 mg total) by mouth daily. 01/05/15   Araceli Bouche, PA   Medications, allergies, past medical history, surgical history, family history, social history and problem list reviewed and updated.  HPI  42 yof with pmh hypothyroidism, iron deficiency anemia presents for med refill and head congestion.  Was seen by [place] one year ago. TSH/T4 at that time normal, refilled synthroid. Pt also mentioned uri sx at that time. She also had cbc and iron studies drawn at that time, had iron def anemia and was started on iron.  Hypothyroid - Today she returns for refills. Needs thyroid med refilled. Denies any palps, anxiety, wt loss, wt gain, energy change, edema.   Anemia - She took the iron for about one month but stopped it as it was causing her GI upset. Denies any blood in stool.   Congestion - sx for past few wks. Head and ear congestion. Sneezing past few days. Watery eyes. Denies cough, otalgia, abd pain, nv, diarrhea, fever, chills. She used to use flonase but has not used for several months.   BP slightly elevated as well as last visit to clinic. Pt states she has  been stressed about several issues.   Review of Systems No cp, sob. See HPI.     Objective:   Physical Exam  Constitutional: She appears well-developed and well-nourished.  Non-toxic appearance. She does not have a sickly appearance. She does not appear ill. No distress.  BP 142/74 mmHg  Pulse 75  Temp(Src) 98.1 F (36.7 C) (Oral)  Resp 20  Ht 5\' 2"  (1.575 m)  Wt 193 lb 3.2 oz (87.635 kg)  BMI 35.33 kg/m2  SpO2 100%   HENT:  Right Ear: Tympanic membrane is not erythematous. A middle ear effusion is present.  Left Ear: Tympanic membrane is not erythematous. A middle ear effusion is present.  Nose: Mucosal edema and rhinorrhea present. Right sinus exhibits no maxillary sinus tenderness and no frontal sinus tenderness. Left sinus exhibits no maxillary sinus tenderness and no frontal sinus tenderness.  Mouth/Throat: Uvula is midline, oropharynx is clear and moist and mucous membranes are normal.  Boggy, swollen turbinates bilaterally.   Neck: No thyroid mass and no thyromegaly present.  Cardiovascular: Normal rate, regular rhythm and normal heart sounds.  Exam reveals no gallop.   No murmur heard. Skin: Skin is warm and dry. No pallor.      Assessment & Plan:   70 yof with pmh hypothyroidism, iron deficiency anemia presents for med refill and head congestion.  Anemia, unspecified anemia type - Plan: CBC, Ferritin,  IBC Panel, Iron, ferrous gluconate (FERGON) 324 MG tablet --try ferrous gluconate, once daily --labs today --rtc for cpe soon as she is due --recheck iron studies 6 months  Allergic rhinitis, unspecified allergic rhinitis type - Plan: fluticasone (FLONASE) 50 MCG/ACT nasal spray, loratadine (CLARITIN) 10 MG tablet --likely cause of congestion --flonase, claritin  Hypothyroidism, unspecified hypothyroidism type - Plan: TSH, T4, Free, levothyroxine (SYNTHROID, LEVOTHROID) 150 MCG tablet --tsh/t4 today --refilled one year  Julieta Gutting, PA-C Physician  [facility]  01/05/2015 1:34 PM

## 2015-01-06 LAB — TSH: TSH: 2.22 u[IU]/mL (ref 0.350–4.500)

## 2015-01-06 LAB — FERRITIN: Ferritin: 7 ng/mL — ABNORMAL LOW (ref 10–291)

## 2015-01-06 LAB — T4, FREE: Free T4: 1 ng/dL (ref 0.80–1.80)

## 2015-01-09 ENCOUNTER — Telehealth: Payer: Self-pay | Admitting: Physician Assistant

## 2015-01-09 DIAGNOSIS — D259 Leiomyoma of uterus, unspecified: Secondary | ICD-10-CM

## 2015-01-09 DIAGNOSIS — D509 Iron deficiency anemia, unspecified: Secondary | ICD-10-CM

## 2015-01-09 NOTE — Telephone Encounter (Signed)
Spoke with Bianca Lewis, informed her her iron deficiency anemia has worsened. She states she is tolerating the ferrous gluconate well. Bianca Lewis instructed to increase to bid for one week then increase to tid as long as she is tolerating. Bianca Lewis states she has hx very heavy periods for many yrs. States she has been told she has fibroids. Interested in gyn referral. She denies fam hx colon ca, recent change in bowel habits, painful defecation. Denies unintentional wt loss. She is sometimes constipated for 2-3 days. This has been stable for yrs. Bianca Lewis instructed to rtc in one month for iron studies and FOBT. She is agreeable.

## 2015-07-18 ENCOUNTER — Telehealth: Payer: Self-pay

## 2015-07-18 NOTE — Telephone Encounter (Signed)
Pt dropped off [employer] form to be completed   Best phone [phone]

## 2015-07-18 NOTE — Telephone Encounter (Signed)
Who can fill this out?  Last provider was [facility].

## 2015-07-18 NOTE — Telephone Encounter (Signed)
Left message for pt to call back  °

## 2015-07-18 NOTE — Telephone Encounter (Signed)
Pt needs to return to have form filled out. She was last seen here 6 months ago by [employer] and he noted that she needed to return for a CPE. This form asks for last dates of TB test and immunizations and we have none of that information on file.

## 2015-07-19 NOTE — Telephone Encounter (Signed)
Patient left message on disabilities/FMLA VM asking about her form. Judging from the previous phone messages patient is aware of what she needs to do to get this form completed.

## 2015-07-19 NOTE — Telephone Encounter (Signed)
Spoke with pt, advised message. Pt understood. 

## 2015-07-27 NOTE — Telephone Encounter (Signed)
Bianca Lewis spoke with patient and states she will come in next week for CPE. Forms placed in nurses box at 102.

## 2016-01-20 ENCOUNTER — Other Ambulatory Visit: Payer: Self-pay | Admitting: Physician Assistant

## 2016-04-18 ENCOUNTER — Ambulatory Visit (INDEPENDENT_AMBULATORY_CARE_PROVIDER_SITE_OTHER): Payer: BLUE CROSS/BLUE SHIELD

## 2016-04-18 ENCOUNTER — Ambulatory Visit (INDEPENDENT_AMBULATORY_CARE_PROVIDER_SITE_OTHER): Payer: BLUE CROSS/BLUE SHIELD | Admitting: Family Medicine

## 2016-04-18 ENCOUNTER — Ambulatory Visit: Payer: BLUE CROSS/BLUE SHIELD

## 2016-04-18 VITALS — BP 130/88 | HR 74 | Temp 99.0°F | Resp 16 | Ht 63.0 in | Wt 192.0 lb

## 2016-04-18 DIAGNOSIS — M25562 Pain in left knee: Secondary | ICD-10-CM | POA: Diagnosis not present

## 2016-04-18 DIAGNOSIS — D259 Leiomyoma of uterus, unspecified: Secondary | ICD-10-CM | POA: Diagnosis not present

## 2016-04-18 DIAGNOSIS — Z124 Encounter for screening for malignant neoplasm of cervix: Secondary | ICD-10-CM

## 2016-04-18 DIAGNOSIS — E034 Atrophy of thyroid (acquired): Secondary | ICD-10-CM

## 2016-04-18 DIAGNOSIS — Z114 Encounter for screening for human immunodeficiency virus [HIV]: Secondary | ICD-10-CM

## 2016-04-18 DIAGNOSIS — E038 Other specified hypothyroidism: Secondary | ICD-10-CM | POA: Diagnosis not present

## 2016-04-18 DIAGNOSIS — N921 Excessive and frequent menstruation with irregular cycle: Secondary | ICD-10-CM | POA: Diagnosis not present

## 2016-04-18 DIAGNOSIS — Z13220 Encounter for screening for lipoid disorders: Secondary | ICD-10-CM | POA: Diagnosis not present

## 2016-04-18 DIAGNOSIS — D509 Iron deficiency anemia, unspecified: Secondary | ICD-10-CM | POA: Diagnosis not present

## 2016-04-18 DIAGNOSIS — Z131 Encounter for screening for diabetes mellitus: Secondary | ICD-10-CM | POA: Diagnosis not present

## 2016-04-18 DIAGNOSIS — Z0000 Encounter for general adult medical examination without abnormal findings: Secondary | ICD-10-CM | POA: Diagnosis not present

## 2016-04-18 LAB — COMPREHENSIVE METABOLIC PANEL
ALT: 12 U/L (ref 6–29)
AST: 17 U/L (ref 10–35)
Albumin: 3.8 g/dL (ref 3.6–5.1)
Alkaline Phosphatase: 81 U/L (ref 33–115)
BILIRUBIN TOTAL: 0.3 mg/dL (ref 0.2–1.2)
BUN: 19 mg/dL (ref 7–25)
CHLORIDE: 108 mmol/L (ref 98–110)
CO2: 24 mmol/L (ref 20–31)
CREATININE: 0.93 mg/dL (ref 0.50–1.10)
Calcium: 9 mg/dL (ref 8.6–10.2)
GLUCOSE: 86 mg/dL (ref 65–99)
Potassium: 4.1 mmol/L (ref 3.5–5.3)
SODIUM: 141 mmol/L (ref 135–146)
Total Protein: 7.4 g/dL (ref 6.1–8.1)

## 2016-04-18 LAB — POC MICROSCOPIC URINALYSIS (UMFC): Mucus: ABSENT

## 2016-04-18 LAB — POCT URINALYSIS DIP (MANUAL ENTRY)
BILIRUBIN UA: NEGATIVE
BILIRUBIN UA: NEGATIVE
Blood, UA: NEGATIVE
GLUCOSE UA: NEGATIVE
LEUKOCYTES UA: NEGATIVE
Nitrite, UA: NEGATIVE
PROTEIN UA: NEGATIVE
Spec Grav, UA: 1.02
Urobilinogen, UA: 0.2
pH, UA: 7

## 2016-04-18 LAB — TSH: TSH: 0.12 m[IU]/L — AB

## 2016-04-18 LAB — CBC WITH DIFFERENTIAL/PLATELET
BASOS ABS: 68 {cells}/uL (ref 0–200)
Basophils Relative: 1 %
EOS PCT: 5 %
Eosinophils Absolute: 340 cells/uL (ref 15–500)
HCT: 29.5 % — ABNORMAL LOW (ref 35.0–45.0)
Hemoglobin: 8.3 g/dL — ABNORMAL LOW (ref 11.7–15.5)
LYMPHS ABS: 2108 {cells}/uL (ref 850–3900)
Lymphocytes Relative: 31 %
MCH: 18.7 pg — AB (ref 27.0–33.0)
MCHC: 28.1 g/dL — AB (ref 32.0–36.0)
MCV: 66.6 fL — ABNORMAL LOW (ref 80.0–100.0)
MONOS PCT: 10 %
Monocytes Absolute: 680 cells/uL (ref 200–950)
NEUTROS ABS: 3604 {cells}/uL (ref 1500–7800)
NEUTROS PCT: 53 %
PLATELETS: 234 10*3/uL (ref 140–400)
RBC: 4.43 MIL/uL (ref 3.80–5.10)
RDW: 20 % — AB (ref 11.0–15.0)
WBC: 6.8 10*3/uL (ref 3.8–10.8)

## 2016-04-18 LAB — LIPID PANEL
CHOL/HDL RATIO: 3.5 ratio (ref <=5.0)
Cholesterol: 151 mg/dL (ref 125–200)
HDL: 43 mg/dL — AB (ref >=46)
LDL CALC: 92 mg/dL (ref <130)
Triglycerides: 79 mg/dL (ref <150)
VLDL: 16 mg/dL (ref <30)

## 2016-04-18 LAB — HEMOGLOBIN A1C
Hgb A1c MFr Bld: 6.6 % — ABNORMAL HIGH (ref <5.7)
MEAN PLASMA GLUCOSE: 143 mg/dL

## 2016-04-18 LAB — IRON: Iron: 15 ug/dL — ABNORMAL LOW (ref 40–190)

## 2016-04-18 LAB — IBC PANEL
%SAT: 3 % — ABNORMAL LOW (ref 11–50)
TIBC: 471 ug/dL — ABNORMAL HIGH (ref 250–450)
UIBC: 456 ug/dL — AB (ref 125–400)

## 2016-04-18 LAB — T4, FREE: Free T4: 1.4 ng/dL (ref 0.8–1.8)

## 2016-04-18 LAB — POCT URINE PREGNANCY: Preg Test, Ur: NEGATIVE

## 2016-04-18 MED ORDER — LEVOTHYROXINE SODIUM 150 MCG PO TABS: 150.0000 ug | ORAL_TABLET | Freq: Every day | ORAL | Status: DC

## 2016-04-18 MED ORDER — FERROUS SULFATE 325 (65 FE) MG PO TABS: 325.0000 mg | ORAL_TABLET | Freq: Every day | ORAL | Status: AC

## 2016-04-18 MED ORDER — MELOXICAM 15 MG PO TABS: 15.0000 mg | ORAL_TABLET | Freq: Every day | ORAL | Status: AC

## 2016-04-18 NOTE — Patient Instructions (Addendum)
Keeping You Healthy  Get These Tests  Blood Pressure- Have your blood pressure checked once a year by your health care provider.  Normal blood pressure is 120/80.  Weight- Have your body mass index (BMI) calculated to screen for obesity.  BMI is measure of body fat based on height and weight.  You can also calculate your own BMI at GravelBags.it.  Cholesterol- Have your cholesterol checked every 5 years starting at age 50 then yearly starting at age 73.  Chlamydia, HIV, and other sexually transmitted diseases- Get screened every year until age 73, then within three months of each new sexual provider.  Pap Test - Every 1-5 years; discuss with your health care provider.  Mammogram- Every 1-2 years starting at age 67--50  Take these medicines  Calcium with Vitamin D-Your body needs 1200 mg of Calcium each day and [phone] IU of Vitamin D daily.  Your body can only absorb 500 mg of Calcium at a time so Calcium must be taken in 2 or 3 divided doses throughout the day.  Multivitamin with folic acid- Once daily if it is possible for you to become pregnant.  Get these Immunizations  Gardasil-Series of three doses; prevents HPV related illness such as genital warts and cervical cancer.  Menactra-Single dose; prevents meningitis.  Tetanus shot- Every 10 years.  Flu shot-Every year.  Take these steps  Do not smoke-Your healthcare provider can help you quit.  For tips on how to quit go to www.smokefree.gov or call 1-800 QUITNOW.  Be physically active- Exercise 5 days a week for at least 30 minutes.  If you are not already physically active, start slow and gradually work up to 30 minutes of moderate physical activity.  Examples of moderate activity include walking briskly, dancing, swimming, bicycling, etc.  Breast Cancer- A self breast exam every month is important for early detection of breast cancer.  For more information and instruction on self breast exams, ask your  healthcare provider or https://www.patel.info/.  Eat a healthy diet- Eat a variety of healthy foods such as fruits, vegetables, whole grains, low fat milk, low fat cheeses, yogurt, lean meats, poultry and fish, beans, nuts, tofu, etc.  For more information go to www. Thenutritionsource.org  Drink alcohol in moderation- Limit alcohol intake to one drink or less per day. Never drink and drive.  Depression- Your emotional health is as important as your physical health.  If you're feeling down or losing interest in things you normally enjoy please talk to your healthcare provider about being screened for depression.  Dental visit- Brush and floss your teeth twice daily; visit your dentist twice a year.  Eye doctor- Get an eye exam at least every 2 years.  Helmet use- Always wear a helmet when riding a bicycle, motorcycle, rollerblading or skateboarding.  Safe sex- If you may be exposed to sexually transmitted infections, use a condom.  Seat belts- Seat belts can save your live; always wear one.  Smoke/Carbon Monoxide detectors- These detectors need to be installed on the appropriate level of your home. Replace batteries at least once a year.  Skin cancer- When out in the sun please cover up and use sunscreen 15 SPF or higher.  Violence- If anyone is threatening or hurting you, please tell your healthcare provider.           IF you received an x-ray today, you will receive an invoice from [facility]. Please contact [facility] at [phone] with questions or concerns regarding your invoice.  IF you received labwork today, you will receive an invoice from [employer]. Please contact [facility] at [phone] with questions or concerns regarding your invoice.   Our billing staff will not be able to assist you with questions regarding bills from these companies.  You will be contacted with the lab results as soon as  they are available. The fastest way to get your results is to activate your My Chart account. Instructions are located on the last page of this paperwork. If you have not heard from [place] regarding the results in 2 weeks, please contact this office.     Medial Collateral Knee Ligament Sprain With Phase I Rehab The medial collateral ligament (MCL) of the knee helps hold the knee joint in proper alignment and prevents the bones from shifting out of alignment (displacing) to the inside (medially). Injury to the knee may cause a tear in the MCL ligament (sprain). Sprains may heal without treatment, but this often results in a loose joint. Sprains are classified into three categories. Grade 1 sprains cause pain, but the tendon is not lengthened. Grade 2 sprains include a lengthened ligament, due to the ligament being stretched or partially ruptured. With grade 2 sprains, there is still function, although possibly decreased. Grade 3 sprains involve a complete tear of the tendon or muscle, and function is usually impaired. SYMPTOMS   Pain and tenderness on the inner side of the knee.  A "pop," tearing or pulling sensation at the time of injury.  Bruising (contusion) at the site of injury, within 48 hours of injury.  Knee stiffness.  Limping, often walking with the knee bent. CAUSES  An MCL sprain occurs when a force is placed on the ligament that is greater than it can handle. Common mechanisms of injury include:  Direct hit (trauma) to the outer side of the knee, especially if the foot is planted on the ground.  Forceful pivoting of the body and leg, while the foot is planted on the ground. RISK INCREASES WITH:  Contact sports (football, rugby).  Sports that require pivoting or cutting (soccer).  Poor knee strength and flexibility.  Improper equipment use. PREVENTION  Warm up and stretch properly before activity.  Maintain physical fitness:  Strength, flexibility and  endurance.  Cardiovascular fitness.  Wear properly fitted protective equipment (correct length of cleats for surface).  Functional braces may be effective in preventing injury. PROGNOSIS  MCL tears usually heal without the need for surgery. Sometimes however, surgery is required. RELATED COMPLICATIONS  Frequently recurring symptoms, such as the knee giving way, knee instability or knee swelling.  Injury to other structures in the knee joint:  Meniscal cartilage, resulting in locking and swelling of the knee.  Articular cartilage, resulting in knee arthritis.  Other ligaments of the knee.  Injury to nerves, resulting in numbness of the outer leg, foot or ankle and weakness or paralysis, with inability to raise the ankle or toes.  Knee stiffness. TREATMENT Treatment first involves the use of ice and medicine, to reduce pain and inflammation. The use of strengthening and stretching exercises may help reduce pain with activity. These exercises may be performed at home, but referral to a therapist is often advised. You may be advised to walk with crutches until you are able to walk without a limp. Your caregiver may provide you with a hinged knee brace to help regain a full range of motion, while also protecting the injured knee. For severe MCL injuries or injuries that involve  other ligaments of the knee, surgery is often advised. MEDICATION  Do not take pain medicine for 7 days before surgery.  Only use over-the-counter pain medicine as directed by your caregiver.  Only use prescription pain relievers as directed and only in needed amounts. HEAT AND COLD  Cold treatment (icing) should be applied for 10 to 15 minutes every 2 to 3 hours for inflammation and pain, and immediately after any activity, that aggravates the symptoms. Use ice packs or an ice massage.  Heat treatment may be used before performing stretching and strengthening activities prescribed by your caregiver, physical  therapist or athletic trainer. Use a heat pack or warm water soak. SEEK MEDICAL CARE IF:   Symptoms get worse or do not improve in 4 to 6 weeks, despite treatment.  New, unexplained symptoms develop. EXERCISES  PHASE I EXERCISES  RANGE OF MOTION (ROM) AND STRETCHING EXERCISES-Medial Collateral Knee Ligament Sprain Phase I These are some of the initial exercises that your physician, physical therapist or athletic trainer may have you perform to begin your rehabilitation. When you demonstrate gains in your flexibility and strength, your caregiver may progress you to Phase II exercises. As you perform these exercises, remember:  These initial exercises are intended to be gentle. They will help you restore motion without increasing any swelling.  Completing these exercises allows less painful movement and prepares you for the more aggressive strengthening exercises in Phase II.  An effective stretch should be held for at least 30 seconds.  A stretch should never be painful. You should only feel a gentle lengthening or release in the stretched tissue. RANGE OF MOTION-Knee Flexion, Active  Lie on your back with both knees straight. (If this causes back discomfort, bend your healthy knee, placing your foot flat on the floor.)  Slowly slide your heel back toward your buttocks until you feel a gentle stretch in the front of your knee or thigh.  Hold for __________ seconds. Slowly slide your heel back to the starting position. Repeat __________ times. Complete this exercise __________ times per day. STRETCH-Knee Flexion, Supine  Lie on the floor with your right / left heel and foot lightly touching the wall. (Place both feet on the wall if you do not use a door frame.)  Without using any effort, allow gravity to slide your foot down the wall slowly until you feel a gentle stretch in the front of your right / left knee.  Hold this stretch for __________ seconds. Then return the leg to the  starting position, using your health leg for help, if needed. Repeat __________ times. Complete this stretch __________ times per day. RANGE OF MOTION-Knee Flexion and Extension, Active-Assisted  Sit on the edge of a table or chair with your thighs firmly supported. It may be helpful to place a folded towel under the end of your right / left thigh.  Flexion (bending): Place the ankle of your healthy leg on top of the other ankle. Use your healthy leg to gently bend your right / left knee until you feel a mild tension across the top of your knee.  Hold for __________ seconds.  Extension (straightening): Switch your ankles so your right / left leg is on top. Use your healthy leg to straighten your right / left knee until you feel a mild tension on the backside of your knee.  Hold for __________ seconds. Repeat __________ times. Complete this exercise __________ times per day. STRETCH-Knee Extension Sitting  Sit with your right / left  leg/heel propped on another chair, coffee table, or foot stool.  Allow your leg muscles to relax, letting gravity straighten out your knee.*  You should feel a stretch behind your right / left knee. Hold this position for __________ seconds. Repeat __________ times. Complete this stretch __________ times per day. *Your physician, physical therapist or athletic trainer may instruct you to place a __________ weight on your thigh, just above your kneecap, to deepen the stretch. STRENGTHENING EXERCISES-Medial Collateral Knee Ligament Sprain Phase I These exercises may help you when beginning to rehabilitate your injury. They may resolve your symptoms with or without further involvement from your physician, physical therapist or athletic trainer. While completing these exercises, remember:   In order to return to more demanding activities, you will likely need to progress to more challenging exercises. Your physician, physical therapist or athletic trainer will  advance your exercises when your tissues show adequate healing and your muscles demonstrate increased strength.  Muscles can gain both the endurance and the strength needed for everyday activities through controlled exercises.  Complete these exercises as instructed by your physician, physical therapist or athletic trainer. Increase the resistance and repetitions only as guided by your caregiver. STRENGTH-Quadriceps, Isometrics  Lie on your back with your right / left leg extended and your opposite knee bent.  Gradually tense the muscles in the front of your right / left thigh. You should see either your kneecap slide up toward your hip or an increased dimpling just above the knee. This motion will push the back of the knee down toward the floor, mat or bed on which you are lying.  Hold the muscle as tight as you can without increasing your pain for __________ seconds.  Relax the muscles slowly and completely in between each repetition. Repeat __________ times. Complete this exercise __________ times per day. STRENGTH-Quadriceps, Short Arcs  Lie on your back. Place a __________ inch towel roll under your knee so that the knee slightly bends.  Raise only your lower leg by tightening the muscles in the front of your thigh. Do not allow your thigh to rise.  Hold this position for __________ seconds. Repeat __________ times. Complete this exercise __________ times per day. OPTIONAL ANKLE WEIGHTS: Begin with ____________________, but DO NOT exceed ____________________. Increase in 1 pound/0.5 kilogram increments.  STRENGTH--Quadriceps, Straight Leg Raises Quality counts! Watch for signs that the quadriceps muscle is working, to be sure you are strengthening the correct muscles and not "cheating" by substituting with healthier muscles.  Lay on your back with your right / left leg extended and your opposite knee bent.  Tense the muscles in the front of your right / left thigh. You should see  either your knee cap slide up or increased dimpling just above the knee. Your thigh may even shake a bit.  Tighten these muscles even more and raise your leg 4 to 6 inches off the floor. Hold for __________ seconds.  Keeping these muscles tense, lower your leg.  Relax the muscles slowly and completely in between each repetition. Repeat __________ times. Complete this exercise __________ times per day. STRENGTH-Hamstring, Isometrics  Lie on your back on a firm surface.  Bend your right / left knee approximately __________ degrees.  Dig your heel into the surface as if you are trying to pull it toward your buttocks. Tighten the muscles in the back of your thighs to "dig" as hard as you can, without increasing any pain.  Hold this position for __________ seconds.  Release  the tension gradually and allow your muscle to completely relax for __________ seconds in between each exercise. Repeat __________ times. Complete this exercise __________ times per day. STRENGTH-Hamstring, Curls  Lay on your stomach with your legs extended. (If you lay on a bed, your feet may hang over the edge.)  Tighten the muscles in the back of your thigh to bend your right / left knee up to 90 degrees. Keep your hips flat on the bed.  Hold this position for __________ seconds.  Slowly lower your leg back to the starting position. Repeat __________ times. Complete this exercise __________ times per day. OPTIONAL ANKLE WEIGHTS: Begin with ____________________, but DO NOT exceed ____________________. Increase in 1 pound/0.5 kilogram increments.    This information is not intended to replace advice given to you by your health care provider. Make sure you discuss any questions you have with your health care provider.   Document Released: 11/18/2005 Document Revised: 12/09/2014 Document Reviewed: 03/02/2009 Elsevier Interactive Patient Education [employer].

## 2016-04-18 NOTE — Progress Notes (Signed)
Subjective:    Patient ID: Bianca Lewis, female    DOB: 1966/05/18, 50 y.o.   MRN: [id number]  04/18/2016  Knee Pain and Medication Refill   HPI This 50 y.o. female presents for one year follow-up:  1. Hypothyroidism: s/p partial thyroidectomy in [place]; maintained on on medication.  2. Menorrhagia: bleeds for ten days; irregular menses; onset in past six months.  Last pap smear years ago.  Last pap smear 11 years ago.  No insurance in the past.   Taking iron tablet sporadically; causes nausea.  Diagnosed with fibroids.  Last physical: Pap smear:  11 years ago. Mammogram:  never Colonoscopy:  never TDAP:  2 years ago work related; laceration at work. Influenza:  yearly Eye exam: Dental exam:  3. L knee pain: moderate pain; medial pain.  Pain with extension.  +swelling.  +giving out.  Going up stairs causes worsening pain.  +popping.  No medication.  Ibuprofen.  Onset two months.  Icing.  No brace or support.  LMP 03-18-2016   Review of Systems  Constitutional: Negative for fever, chills, diaphoresis and fatigue.  Eyes: Negative for visual disturbance.  Respiratory: Negative for cough and shortness of breath.   Cardiovascular: Negative for chest pain, palpitations and leg swelling.  Gastrointestinal: Positive for nausea. Negative for vomiting, abdominal pain, diarrhea and constipation.  Endocrine: Negative for cold intolerance, heat intolerance, polydipsia, polyphagia and polyuria.  Genitourinary: Positive for menstrual problem.  Musculoskeletal: Positive for joint swelling, arthralgias and gait problem.  Neurological: Negative for dizziness, tremors, seizures, syncope, facial asymmetry, speech difficulty, weakness, light-headedness, numbness and headaches.    Past Medical History  Diagnosis Date  . Thyroid disease   . Anemia    Past Surgical History  Procedure Laterality Date  . Appendectomy    . Thyroidectomy, partial      Goiter   No Known Allergies  Social History    Social History  . Marital Status: Married    Spouse Name: N/A  . Number of Children: N/A  . Years of Education: N/A   Occupational History  . CNA    Social History Main Topics  . Smoking status: Never Smoker   . Smokeless tobacco: Not on file  . Alcohol Use: No  . Drug Use: No  . Sexual Activity: Yes    Birth Control/ Protection: None   Other Topics Concern  . Not on file   Social History Narrative   Marital status: married x 6 years; from [place]; [place] in [place]: 3 children      Employment: Scientist, physiological CNA      Lives: with husband, 3 boys      Tobacco: none      Alcohol: none   Family History  Problem Relation Age of Onset  . Diabetes Mother   . Cancer Father     prostate cancer  . Diabetes Sister   . Diabetes Brother        Objective:    BP 130/88 mmHg  Pulse 74  Temp(Src) 99 F (37.2 C)  Resp 16  Ht 5\' 3"  (1.6 m)  Wt 192 lb (87.091 kg)  BMI 34.02 kg/m2  SpO2 99%  LMP 03/19/2016 (Approximate) Physical Exam  Constitutional: She is oriented to person, place, and time. She appears well-developed and well-nourished. No distress.  HENT:  Head: Normocephalic and atraumatic.  Right Ear: External ear normal.  Left Ear: External ear normal.  Nose: Nose normal.  Mouth/Throat:  Oropharynx is clear and moist.  Eyes: Conjunctivae and EOM are normal. Pupils are equal, round, and reactive to light.  Neck: Normal range of motion. Neck supple. Carotid bruit is not present. No thyromegaly present.  Cardiovascular: Normal rate, regular rhythm, normal heart sounds and intact distal pulses.  Exam reveals no gallop and no friction rub.   No murmur heard. Pulmonary/Chest: Effort normal and breath sounds normal. She has no wheezes. She has no rales.  Abdominal: Soft. Bowel sounds are normal. She exhibits no distension and no mass. There is no tenderness. There is no rebound and no guarding.  Musculoskeletal:       Left knee: She exhibits normal range  of motion, no swelling and no bony tenderness. Tenderness found. Medial joint line tenderness noted. No lateral joint line and no patellar tendon tenderness noted.  Lymphadenopathy:    She has no cervical adenopathy.  Neurological: She is alert and oriented to person, place, and time. No cranial nerve deficit.  Skin: Skin is warm and dry. No rash noted. She is not diaphoretic. No erythema. No pallor.  Psychiatric: She has a normal mood and affect. Her behavior is normal.        Assessment & Plan:   1. Routine physical examination   2. Hypothyroidism due to acquired atrophy of thyroid   3. Anemia, iron deficiency   4. Menorrhagia with irregular cycle   5. Uterine leiomyoma, unspecified location   6. Left medial knee pain   7. Cervical cancer screening   8. Screening for diabetes mellitus   9. Screening, lipid   10. Screening for HIV (human immunodeficiency virus)     Orders Placed This Encounter  Procedures  . CBC with Differential/Platelet  . Comprehensive metabolic panel    Order Specific Question:  Has the patient fasted?    Answer:  Yes  . Hemoglobin A1c  . Lipid panel    Order Specific Question:  Has the patient fasted?    Answer:  Yes  . TSH  . HIV antibody  . T4, free  . Iron  . IBC panel  . Ambulatory referral to Gynecology    Referral Priority:  Routine    Referral Type:  Consultation    Referral Reason:  Specialty Services Required    Requested Specialty:  Gynecology    Number of Visits Requested:  1  . POCT urine pregnancy  . POCT urinalysis dipstick  . POCT Microscopic Urinalysis ([facility])   Meds ordered this encounter  Medications  . DISCONTD: levothyroxine (SYNTHROID, LEVOTHROID) 150 MCG tablet    Sig: Take 1 tablet (150 mcg total) by mouth daily.    Dispense:  90 tablet    Refill:  3  . meloxicam (MOBIC) 15 MG tablet    Sig: Take 1 tablet (15 mg total) by mouth daily.    Dispense:  30 tablet    Refill:  0  . ferrous sulfate 325 (65 FE) MG tablet     Sig: Take 1 tablet (325 mg total) by mouth at bedtime.    Dispense:  30 tablet    Refill:  11  . levothyroxine (SYNTHROID, LEVOTHROID) 125 MCG tablet    Sig: Take 1 tablet (125 mcg total) by mouth daily before breakfast.    Dispense:  90 tablet    Refill:  3    PLEASE DELETE REFILLS OF 150MCG SYNTHROID.    No Follow-up on file.    Sheyenne Konz Elayne Guerin, M.D. [facility] [address] [place], [place]  [phone] phone [phone] fax

## 2016-04-19 LAB — HIV ANTIBODY (ROUTINE TESTING W REFLEX): HIV 1&2 Ab, 4th Generation: NONREACTIVE

## 2016-04-29 ENCOUNTER — Emergency Department (HOSPITAL_COMMUNITY): Payer: Worker's Compensation

## 2016-04-29 ENCOUNTER — Emergency Department (HOSPITAL_COMMUNITY)
Admission: EM | Admit: 2016-04-29 | Discharge: 2016-04-29 | Disposition: A | Discharge status: Home / Self Care | Payer: Worker's Compensation | Attending: Emergency Medicine | Admitting: Emergency Medicine

## 2016-04-29 ENCOUNTER — Encounter (HOSPITAL_COMMUNITY): Payer: Self-pay | Admitting: *Deleted

## 2016-04-29 DIAGNOSIS — D649 Anemia, unspecified: Secondary | ICD-10-CM | POA: Diagnosis not present

## 2016-04-29 DIAGNOSIS — Y9289 Other specified places as the place of occurrence of the external cause: Secondary | ICD-10-CM | POA: Diagnosis not present

## 2016-04-29 DIAGNOSIS — W010XXA Fall on same level from slipping, tripping and stumbling without subsequent striking against object, initial encounter: Secondary | ICD-10-CM | POA: Insufficient documentation

## 2016-04-29 DIAGNOSIS — R931 Abnormal findings on diagnostic imaging of heart and coronary circulation: Secondary | ICD-10-CM | POA: Insufficient documentation

## 2016-04-29 DIAGNOSIS — S3992XA Unspecified injury of lower back, initial encounter: Secondary | ICD-10-CM | POA: Insufficient documentation

## 2016-04-29 DIAGNOSIS — Y9389 Activity, other specified: Secondary | ICD-10-CM | POA: Insufficient documentation

## 2016-04-29 DIAGNOSIS — Z791 Long term (current) use of non-steroidal anti-inflammatories (NSAID): Secondary | ICD-10-CM | POA: Insufficient documentation

## 2016-04-29 DIAGNOSIS — I517 Cardiomegaly: Secondary | ICD-10-CM

## 2016-04-29 DIAGNOSIS — Z79899 Other long term (current) drug therapy: Secondary | ICD-10-CM | POA: Insufficient documentation

## 2016-04-29 DIAGNOSIS — S29011A Strain of muscle and tendon of front wall of thorax, initial encounter: Secondary | ICD-10-CM | POA: Insufficient documentation

## 2016-04-29 DIAGNOSIS — W19XXXA Unspecified fall, initial encounter: Secondary | ICD-10-CM

## 2016-04-29 DIAGNOSIS — E079 Disorder of thyroid, unspecified: Secondary | ICD-10-CM | POA: Insufficient documentation

## 2016-04-29 DIAGNOSIS — M545 Low back pain, unspecified: Secondary | ICD-10-CM

## 2016-04-29 DIAGNOSIS — Y990 Civilian activity done for income or pay: Secondary | ICD-10-CM | POA: Diagnosis not present

## 2016-04-29 DIAGNOSIS — S299XXA Unspecified injury of thorax, initial encounter: Secondary | ICD-10-CM | POA: Diagnosis present

## 2016-04-29 MED ORDER — IBUPROFEN 200 MG PO TABS
600.0000 mg | ORAL_TABLET | Freq: Once | ORAL | Status: AC
  Administered 2016-04-29: 600 mg via ORAL
  Filled 2016-04-29: qty 1

## 2016-04-29 NOTE — ED Provider Notes (Signed)
CSN: [id number]     Arrival date & time 04/29/16  1613 History  By signing my name below, I, Dora Sims, attest that this documentation has been prepared under the direction and in the presence of non-physician practitioner, Pearlie Oyster, PA-C. Electronically Signed: Dora Sims, Scribe. 04/29/2016. 4:51 PM.   Chief Complaint  Patient presents with  . Fall    The history is provided by the patient. No language interpreter was used.     HPI Comments: Bianca Lewis is a 50 y.o. female who presents to the Emergency Department complaining of sudden onset, constant, right-sided pain s/p falling on a tile floor at work a couple of hours ago. Pt reports that she slipped on water and fell back, landing on her right buttock and right breast. She did not hit her head or lose consciousness during the incident. She endorses pain in her right buttock, right lower back, right ribs, and upper right breast. Pt endorses pain exacerbation with palpation to these areas as well as with right arm raising. Pt has not taken any medications or applied ice for her pains. She is able to ambulate without difficulty. Pt works at [employer]. She denies nausea, vomiting, neuro deficits, numbness, weakness, or any other associated symptoms.  Past Medical History  Diagnosis Date  . Thyroid disease   . Anemia    Past Surgical History  Procedure Laterality Date  . Appendectomy    . Thyroidectomy, partial      Goiter   Family History  Problem Relation Age of Onset  . Diabetes Mother   . Cancer Father     prostate cancer  . Diabetes Sister   . Diabetes Brother    Social History  Substance Use Topics  . Smoking status: Never Smoker   . Smokeless tobacco: None  . Alcohol Use: No   OB History    No data available     Review of Systems  Gastrointestinal: Negative for vomiting.  Musculoskeletal: Positive for back pain (right lower) and arthralgias.       Positive for right rib pain.   Neurological: Negative for syncope, weakness and numbness.       Negative for sensation loss.   Allergies  Review of patient's allergies indicates no known allergies.  Home Medications   Prior to Admission medications   Medication Sig Start Date End Date Taking? Authorizing Provider  ferrous gluconate (FERGON) 324 MG tablet Take 1 tablet (324 mg total) by mouth daily with breakfast. Do not take at the same time as your thyroid medication. Do not take at same time as iron containing foods. Patient not taking: Reported on 04/18/2016 01/05/15   Araceli Bouche, PA  ferrous sulfate 325 (65 FE) MG tablet Take 1 tablet (325 mg total) by mouth at bedtime. 04/18/16   Wardell Honour, MD  fluticasone (FLONASE) 50 MCG/ACT nasal spray Place 2 sprays into both nostrils daily. Patient not taking: Reported on 04/18/2016 01/05/15   Araceli Bouche, PA  levothyroxine (SYNTHROID, LEVOTHROID) 150 MCG tablet Take 1 tablet (150 mcg total) by mouth daily. 04/18/16   Wardell Honour, MD  loratadine (CLARITIN) 10 MG tablet Take 1 tablet (10 mg total) by mouth daily. 01/05/15   Araceli Bouche, PA  meloxicam (MOBIC) 15 MG tablet Take 1 tablet (15 mg total) by mouth daily. 04/18/16   Wardell Honour, MD   BP 136/65 mmHg  Pulse 89  Temp(Src) 98.1 F (36.7 C) (Oral)  Resp 20  Ht 5'  2" (1.575 m)  Wt 87.091 kg  BMI 35.11 kg/m2  SpO2 99%  LMP 04/18/2016 Physical Exam  Constitutional: She is oriented to person, place, and time. She appears well-developed and well-nourished. No distress.  HENT:  Head: Normocephalic and atraumatic.  Neck:  Full ROM without pain. No midline or paraspinal tenderness.   Cardiovascular: Normal rate, regular rhythm and normal heart sounds.   Pulmonary/Chest: Effort normal and breath sounds normal. No respiratory distress.  Abdominal: Soft. She exhibits no distension. There is no tenderness.  No bruising or erythema.   Musculoskeletal:       Arms: TTP as depicted in image. No midline spinal  tenderness. No tenderness of clavicle or shoulder. Full ROM of right shoulder, no step off, deformity, or crepitus noted. 5/5 muscle strength of all four extremities. Straight leg raises negative bilaterally.   Neurological: She is alert and oriented to person, place, and time.  Skin: Skin is warm and dry.  Psychiatric: She has a normal mood and affect. Her behavior is normal.  Nursing note and vitals reviewed.   ED Course  Procedures (including critical care time)  DIAGNOSTIC STUDIES: Oxygen Saturation is 99% on RA, normal by my interpretation.    COORDINATION OF CARE: 4:51 PM Discussed treatment plan with pt at bedside and pt agreed to plan.  Labs Review Labs Reviewed - No data to display  Imaging Review Dg Ribs Unilateral W/chest Right  04/29/2016  CLINICAL DATA:  Fall.  Right chest pain EXAM: RIGHT RIBS AND CHEST - 3+ VIEW COMPARISON:  09/05/2014 FINDINGS: Cardiac enlargement with mild vascular congestion. Negative for edema or effusion. No pneumothorax. No focal infiltrate Negative for right rib fracture. Surgical clips in the right thyroid bed. IMPRESSION: Negative for right rib fracture Cardiac enlargement with mild vascular congestion.  No edema. Electronically Signed   By: Franchot Gallo M.D.   On: 04/29/2016 17:37   I have personally reviewed and evaluated these images and lab results as part of my medical decision-making.   EKG Interpretation None      MDM   Final diagnoses:  Right-sided low back pain without sciatica  Muscle strain of chest wall, initial encounter  Fall, initial encounter  Enlargement of cardiac chamber on chest x-ray   [name] resents to the ED after a mechanical fall after slipping on water while at work. No head injury. Back with TTP of right lumbar area. No midline tenderness. Full muscle strength and bilateral LE NVI. No need for imaging of back at this time. Chest wall / right rib area with tenderness. CXR with rib series obtained which was  negative for acute injuries - cardiac enlargement noted. Incidental finding discussed with patient who agrees to follow up with PCP in regard's to finding. Symptomatic home care instructions given. Paperwork for work filled out - will place on light duty with no heavy lifting or prolonged standing x 3 days. If no improvement in symptoms, ortho follow up. Return precautions given and all questions answered.   I personally performed the services described in this documentation, which was scribed in my presence. The recorded information has been reviewed and is accurate.  [facility], PA-C 04/29/16 1821  Leo Grosser, MD 04/30/16 [phone]

## 2016-04-29 NOTE — ED Notes (Signed)
Declined W/C at D/C and was escorted to lobby by RN. 

## 2016-04-29 NOTE — ED Notes (Signed)
Pt reports she slipped in water and fell at work . Pt now reports pain to buttocks and rt side. Pt ambulatory to room . Pt denies hitting her head.

## 2016-04-29 NOTE — Discharge Instructions (Signed)
Ibuprofen as needed for pain. Ice affected area (instructions below). If symptoms persist longer than 5 days, follow up with the orthopedic physician listed. As we discussed, you need to schedule an appointment with your primary care physician at their next available appointment. Let them know that you had a chest x-ray which showed your heart was slightly enlarged. Return to ER for new or worsening symptoms, any additional concerns.

## 2016-05-19 ENCOUNTER — Encounter: Payer: Self-pay | Admitting: Family Medicine

## 2016-05-19 MED ORDER — LEVOTHYROXINE SODIUM 125 MCG PO TABS: 125.0000 ug | ORAL_TABLET | Freq: Every day | ORAL | Status: DC

## 2016-05-29 ENCOUNTER — Telehealth: Payer: Self-pay | Admitting: Emergency Medicine

## 2016-05-29 NOTE — Telephone Encounter (Signed)
-----   Message from Wardell Honour, MD sent at 05/19/2016 11:48 AM EDT ----- 1. No evidence of HIV.  2.  Hemoglobin A1c is a three month average of sugars; your level is elevated and consistent with mild DIABETES.  I recommend weight loss, exercise, low-sugar and low-carbohydrate food choices. I recommend AVOIDING: sweet tea, regular soda, fruit juice. I recommend LIMITING: potatoes, breads, pastas, rice, and dessert.  I recommend follow-up in 3 months for repeat sugar level and Hemoglobin A1c.   3.  Thyroid function is over-corrected/overactive.  4. Liver and kidney functions are normal.  5.  Cholesterol is normal.   6. Iron level remains low.  Anemia is also persistent with a hemoglobin of 8.3.  You need to start taking iron daily; I would take every evening after supper to help decrease nausea.

## 2016-06-06 ENCOUNTER — Ambulatory Visit: Payer: BLUE CROSS/BLUE SHIELD | Admitting: Certified Nurse Midwife

## 2016-10-28 ENCOUNTER — Telehealth: Payer: Self-pay

## 2016-10-28 NOTE — Telephone Encounter (Signed)
[facility] pharmacy is calling stating that they have questions about levothyroxine 125 micro grams something about NEW [employer] AB rated per pharmacy  [phone]

## 2016-10-30 NOTE — Telephone Encounter (Signed)
Pharm LM clarifying that they have a new manufacturer for generic levothyroxine and want authorization to fill Rx with this new generic. Dr Tamala Julian, I know that there can be a difference between the different generics so wanted to check with you to make sure you are OK with this. Do you need for pt to RTC sooner for TSH?

## 2016-11-01 NOTE — Telephone Encounter (Signed)
If patient has been receiving a generic levothyroxine, it is OK to replace with another generic levothyroxine. If patient has been filling name brand Synthroid, why are they wanting to replace with generic?  Is this patient request?

## 2016-11-01 NOTE — Telephone Encounter (Signed)
Verified w/pharm that pt has been receiving another generic. OK'd change to their new manufacturer of generic.

## 2017-03-17 IMAGING — DX DG RIBS W/ CHEST 3+V*R*
3 series · 3 of 3 positions shown · non-contrast
Comparison: 09/05/2014

CLINICAL DATA: Fall.  Right chest pain

EXAM:
RIGHT RIBS AND CHEST - 3+ VIEW

[chest pa]
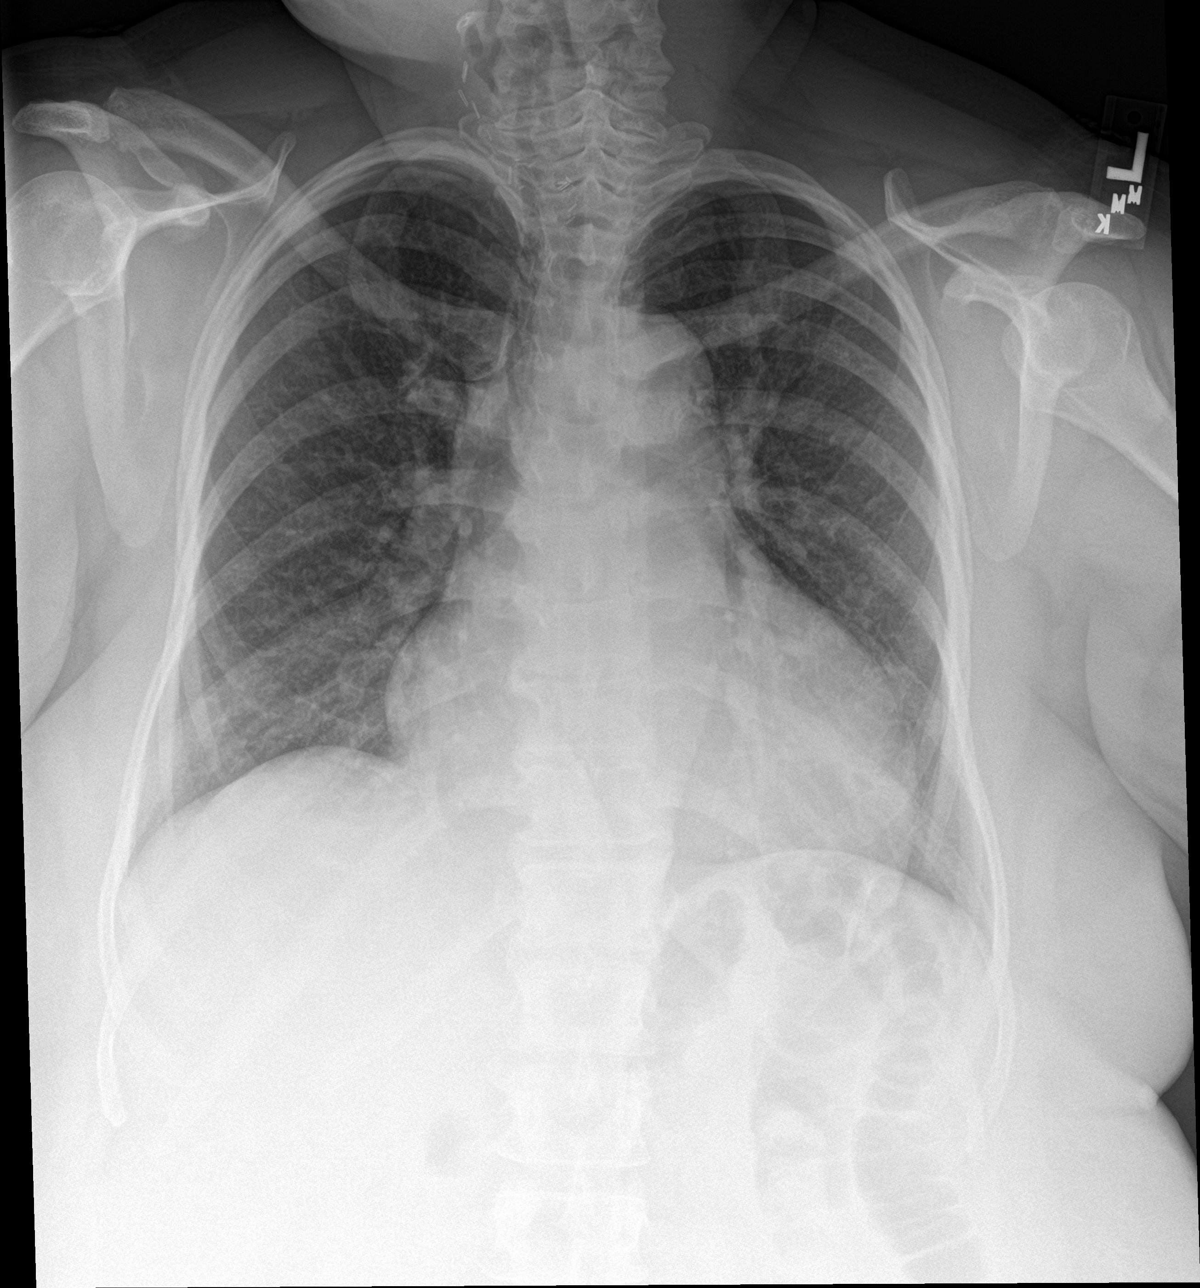

[rib obl]
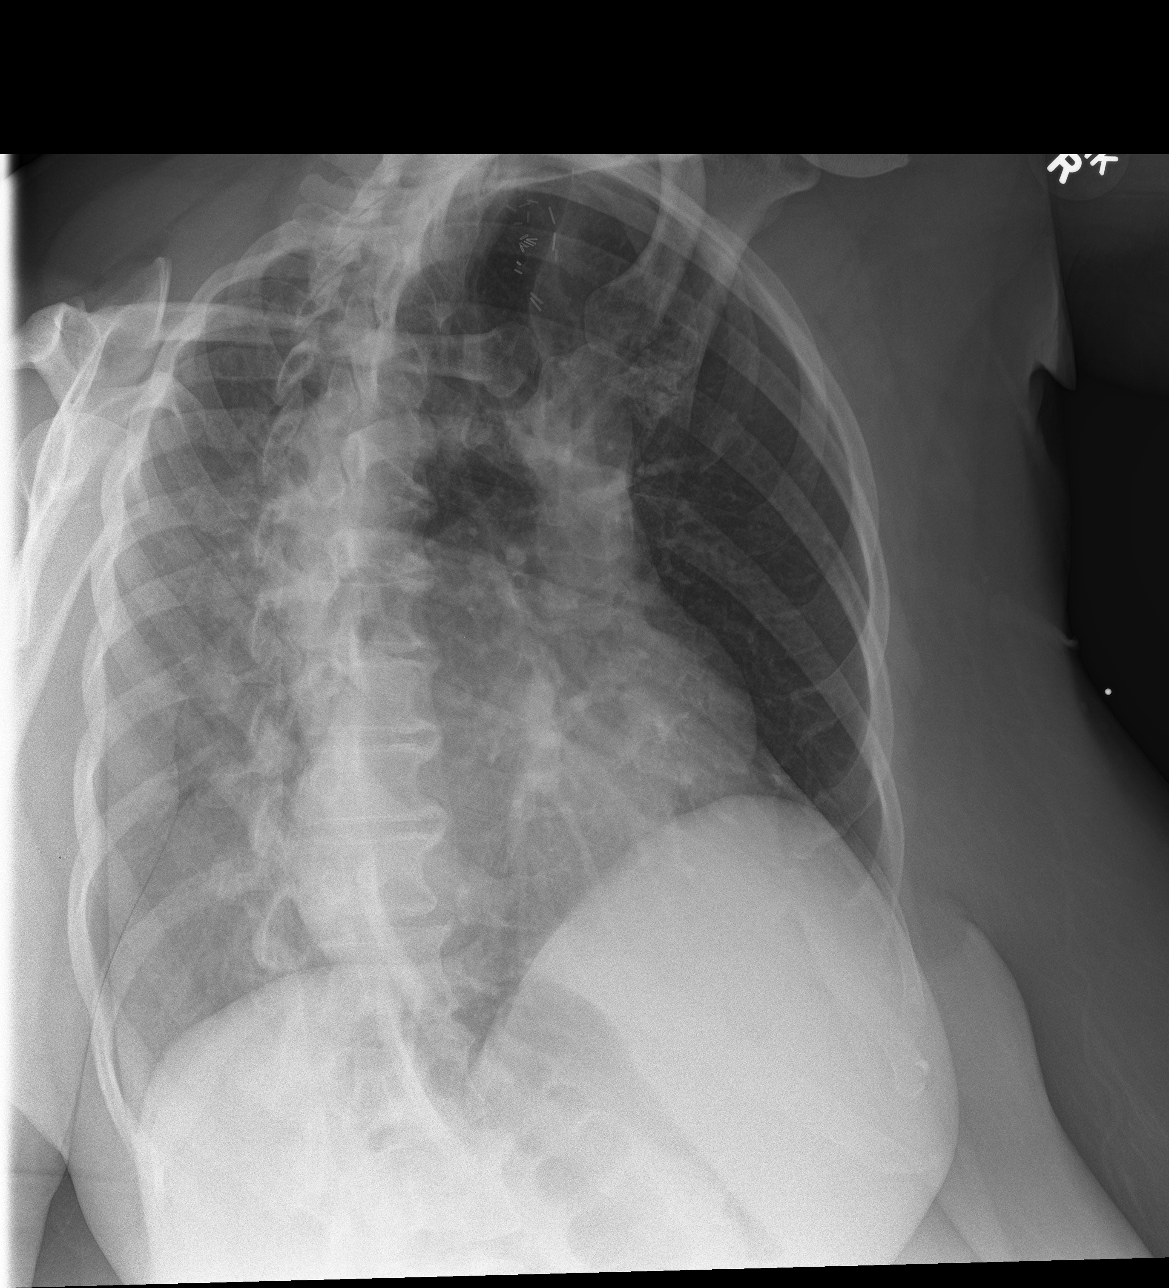

[chest ap]
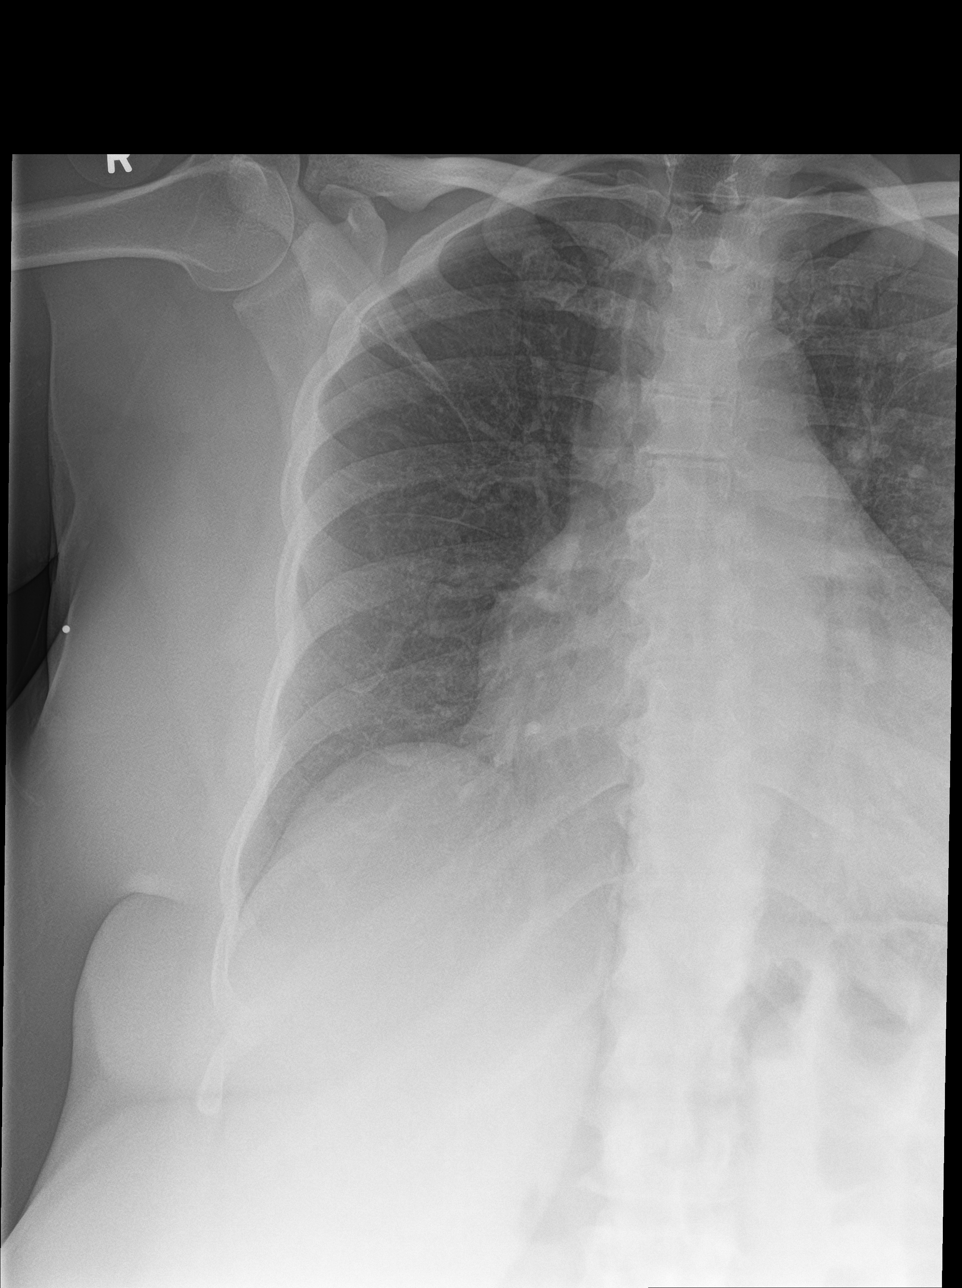

[3 of 3 positions shown; findings below may reference images not displayed]

FINDINGS: Cardiac enlargement with mild vascular congestion. Negative for
edema or effusion. No pneumothorax. No focal infiltrate

Negative for right rib fracture.

Surgical clips in the right thyroid bed.
IMPRESSION: Negative for right rib fracture

Cardiac enlargement with mild vascular congestion.  No edema.

## 2017-09-04 ENCOUNTER — Other Ambulatory Visit: Payer: Self-pay | Admitting: Family Medicine

## 2018-04-23 ENCOUNTER — Encounter: Payer: Self-pay | Admitting: Family Medicine

## 2019-05-12 ENCOUNTER — Encounter: Payer: Self-pay | Admitting: Internal Medicine

## 2021-06-12 ENCOUNTER — Other Ambulatory Visit: Payer: Self-pay | Admitting: Physician Assistant

## 2021-06-12 DIAGNOSIS — Z1231 Encounter for screening mammogram for malignant neoplasm of breast: Secondary | ICD-10-CM

## 2023-03-11 ENCOUNTER — Other Ambulatory Visit: Payer: Self-pay | Admitting: Internal Medicine

## 2023-03-11 DIAGNOSIS — Z1231 Encounter for screening mammogram for malignant neoplasm of breast: Secondary | ICD-10-CM
